# Patient Record
Sex: Female | Born: 1980 | Race: White | Hispanic: No | Marital: Single | Smoking: Current every day smoker
Health system: Southern US, Community
[De-identification: ages and names within clinical notes are randomized; demographics above are authoritative.]

## PROBLEM LIST (undated history)

## (undated) DIAGNOSIS — D649 Anemia, unspecified: Secondary | ICD-10-CM

---

## 2007-07-18 ENCOUNTER — Emergency Department: Payer: Self-pay | Admitting: Emergency Medicine

## 2007-07-18 ENCOUNTER — Other Ambulatory Visit: Payer: Self-pay

## 2008-02-19 ENCOUNTER — Emergency Department: Payer: Self-pay | Admitting: Emergency Medicine

## 2008-03-14 ENCOUNTER — Emergency Department: Payer: Self-pay | Admitting: Emergency Medicine

## 2008-11-02 ENCOUNTER — Inpatient Hospital Stay: Payer: Self-pay | Admitting: Internal Medicine

## 2009-10-27 ENCOUNTER — Emergency Department: Payer: Self-pay | Admitting: Emergency Medicine

## 2011-10-29 ENCOUNTER — Emergency Department: Payer: Self-pay | Admitting: *Deleted

## 2013-02-17 ENCOUNTER — Emergency Department: Payer: Self-pay

## 2013-10-03 ENCOUNTER — Inpatient Hospital Stay: Payer: Self-pay | Admitting: Obstetrics & Gynecology

## 2013-10-03 LAB — COMPREHENSIVE METABOLIC PANEL
Albumin: 3.6 g/dL (ref 3.4–5.0)
BUN: 9 mg/dL (ref 7–18)
Calcium, Total: 9.1 mg/dL (ref 8.5–10.1)
Chloride: 102 mmol/L (ref 98–107)
Creatinine: 0.89 mg/dL (ref 0.60–1.30)
EGFR (African American): >60
Glucose: 169 mg/dL — ABNORMAL HIGH (ref 65–99)
Osmolality: 269 (ref 275–301)
SGOT(AST): 23 U/L (ref 15–37)
Sodium: 133 mmol/L — ABNORMAL LOW (ref 136–145)
Total Protein: 7.8 g/dL (ref 6.4–8.2)

## 2013-10-03 LAB — URINALYSIS, COMPLETE
Bacteria: NONE SEEN
Bilirubin,UR: NEGATIVE
Nitrite: NEGATIVE
Protein: 30
Specific Gravity: 1.021 (ref 1.003–1.030)
WBC UR: 108 /HPF (ref 0–5)

## 2013-10-03 LAB — LIPASE, BLOOD: Lipase: 64 U/L — ABNORMAL LOW (ref 73–393)

## 2013-10-03 LAB — CBC
HGB: 11.1 g/dL — ABNORMAL LOW (ref 12.0–16.0)
MCV: 84 fL (ref 80–100)
RBC: 3.87 10*6/uL (ref 3.80–5.20)
RDW: 14.8 % — ABNORMAL HIGH (ref 11.5–14.5)

## 2013-10-03 LAB — PREGNANCY, URINE: Pregnancy Test, Urine: NEGATIVE m[IU]/mL

## 2013-10-04 LAB — WBC: WBC: 16.2 10*3/uL — ABNORMAL HIGH (ref 3.6–11.0)

## 2015-07-11 ENCOUNTER — Emergency Department: Payer: Self-pay

## 2015-07-11 ENCOUNTER — Encounter: Payer: Self-pay | Admitting: Emergency Medicine

## 2015-07-11 ENCOUNTER — Emergency Department
Admission: EM | Admit: 2015-07-11 | Discharge: 2015-07-11 | Disposition: A | Discharge status: Home / Self Care | Payer: Self-pay | Attending: Emergency Medicine | Admitting: Emergency Medicine

## 2015-07-11 DIAGNOSIS — Z880 Allergy status to penicillin: Secondary | ICD-10-CM | POA: Insufficient documentation

## 2015-07-11 DIAGNOSIS — Y9389 Activity, other specified: Secondary | ICD-10-CM | POA: Insufficient documentation

## 2015-07-11 DIAGNOSIS — Y990 Civilian activity done for income or pay: Secondary | ICD-10-CM | POA: Insufficient documentation

## 2015-07-11 DIAGNOSIS — S0083XA Contusion of other part of head, initial encounter: Secondary | ICD-10-CM | POA: Insufficient documentation

## 2015-07-11 DIAGNOSIS — Z720 Tobacco use: Secondary | ICD-10-CM | POA: Insufficient documentation

## 2015-07-11 DIAGNOSIS — S5002XA Contusion of left elbow, initial encounter: Secondary | ICD-10-CM | POA: Insufficient documentation

## 2015-07-11 DIAGNOSIS — W010XXA Fall on same level from slipping, tripping and stumbling without subsequent striking against object, initial encounter: Secondary | ICD-10-CM | POA: Insufficient documentation

## 2015-07-11 DIAGNOSIS — Y9289 Other specified places as the place of occurrence of the external cause: Secondary | ICD-10-CM | POA: Insufficient documentation

## 2015-07-11 HISTORY — DX: Anemia, unspecified: D64.9

## 2015-07-11 MED ORDER — CYCLOBENZAPRINE HCL 10 MG PO TABS
10.0000 mg | ORAL_TABLET | Freq: Three times a day (TID) | ORAL | Status: DC | PRN
Start: 2015-07-11 — End: 2015-11-14

## 2015-07-11 MED ORDER — IBUPROFEN 800 MG PO TABS: 800.0000 mg | ORAL_TABLET | Freq: Three times a day (TID) | ORAL | Status: DC | PRN

## 2015-07-11 MED ORDER — KETOROLAC TROMETHAMINE 60 MG/2ML IM SOLN
60.0000 mg | Freq: Once | INTRAMUSCULAR | Status: AC
  Administered 2015-07-11: 60 mg via INTRAMUSCULAR
  Filled 2015-07-11: qty 2

## 2015-07-11 NOTE — ED Notes (Signed)
Pt lost her footing, fell at work, now c/o left sided face pain, bruising noted, knot to left temple, bruise to left elbow and swelling noted, large bruise on inside of left arm, pt able to move her left arm. Workers comp case.

## 2015-07-11 NOTE — ED Provider Notes (Signed)
[facility] Emergency Department Provider Note  ____________________________________________  Time seen: Approximately 7:28 PM  I have reviewed the triage vital signs and the nursing notes.   HISTORY  Chief Complaint Fall    HPI Jillian Gray is a 34 y.o. female who presents for evaluation of multiple contusions and bruising. Patient states that she lost her footing and slipped at work complains of pain to the left side of her face and forehead with a knot to her left temple. Bruising to her left elbow increased pain and swelling, and a large bruise noted on the inside of her left arm. Patient denies any loss of consciousness nausea or vomiting at this time. Because complain of all these areas of bruising is her left elbow.   Past Medical History  Diagnosis Date  . Anemia     There are no active problems to display for this patient.   Past Surgical History  Procedure Laterality Date  . Cesarean section      Current Outpatient Rx  Name  Route  Sig  Dispense  Refill  . cyclobenzaprine (FLEXERIL) 10 MG tablet   Oral   Take 1 tablet (10 mg total) by mouth every 8 (eight) hours as needed for muscle spasms.   30 tablet   1   . ibuprofen (ADVIL,MOTRIN) 800 MG tablet   Oral   Take 1 tablet (800 mg total) by mouth every 8 (eight) hours as needed.   30 tablet   0     Allergies Penicillins  No family history on file.  Social History History  Substance Use Topics  . Smoking status: Current Every Day Smoker    Types: Cigarettes  . Smokeless tobacco: Not on file  . Alcohol Use: No    Review of Systems Constitutional: No fever/chills Eyes: No visual changes. ENT: No sore throat. Cardiovascular: Denies chest pain. Respiratory: Denies shortness of breath. Gastrointestinal: No abdominal pain.  No nausea, no vomiting.  No diarrhea.  No constipation. Genitourinary: Negative for dysuria. Musculoskeletal: Positive for left elbow pain with  limited range of motion with extension and adduction. Positive edema noted around left elbow bruising noted to interior left upper arm. Skin: Negative for rash. Neurological: Positive for headache negative for focal weakness or numbness.  10-point ROS otherwise negative.  ____________________________________________   PHYSICAL EXAM:  VITAL SIGNS: ED Triage Vitals  Enc Vitals Group     BP 07/11/15 1902 124/85 mmHg     Pulse Rate 07/11/15 1902 90     Resp 07/11/15 1902 18     Temp 07/11/15 1902 98.8 F (37.1 C)     Temp Source 07/11/15 1902 Oral     SpO2 07/11/15 1902 99 %     Weight 07/11/15 1902 195 lb (88.451 kg)     Height 07/11/15 1902  (1.702 m)     Head Cir --      Peak Flow --      Pain Score 07/11/15 1903 4     Pain Loc --      Pain Edu? --      Excl. in GC? --     Constitutional: Alert and oriented. Well appearing and in no acute distress. Eyes: Conjunctivae are normal. PERRL. EOMI. Head: Atraumatic. Nose: No congestion/rhinnorhea. Mouth/Throat: Mucous membranes are moist.  Oropharynx non-erythematous. Multiple dental caries and missing teeth obvious. Neck: No stridor.  No cervical spine tenderness. Cardiovascular: Normal rate, regular rhythm. Grossly normal heart sounds.  Good peripheral circulation. Respiratory:  Normal respiratory effort.  No retractions. Lungs CTAB. Gastrointestinal: Soft and nontender. No distention. No abdominal bruits. No CVA tenderness. Musculoskeletal: No lower extremity tenderness nor edema.  No joint effusions. Neurologic:  Normal speech and language. No gross focal neurologic deficits are appreciated. No gait instability. Skin: Bruising noted to the left temple. Bruising noted to the left elbow and in her left humerus Psychiatric: Mood and affect are normal. Speech and behavior are normal.  ____________________________________________   LABS (all labs ordered are listed, but only abnormal results are displayed)  Labs Reviewed  - No data to display ____________________________________________    RADIOLOGY  Facial CT and left elbow x-rays negative interpreted by radiologist and reviewed by myself. ____________________________________________   PROCEDURES  Procedure(s) performed: None  Critical Care performed: No  ____________________________________________   INITIAL IMPRESSION / ASSESSMENT AND PLAN / ED COURSE  Pertinent labs & imaging results that were available during my care of the patient were reviewed by me and considered in my medical decision making (see chart for details).  Status post fall with facial contusion and elbow contusion arm contusion. Patient given Rx for Flexeril and Motrin. Work note given for today. Toradol 60 mg IM given while waiting in the ER. Patient voices no other emergency medical complaints at this time will return to the ER with any worsening symptomology. ____________________________________________   FINAL CLINICAL IMPRESSION(S) / ED DIAGNOSES  Final diagnoses:  Fall due to wet surface, initial encounter  Contusion of face, initial encounter  Elbow contusion, left, initial encounter      Evangeline Dakin, PA-C 07/11/15 2037  Sharman Cheek, MD 07/11/15 2211

## 2015-07-11 NOTE — ED Notes (Signed)
Spoke to pt about workers comp [employer]. Pt understood and has REFUSED further workers comp process. Will make MD aware.

## 2015-07-11 NOTE — Discharge Instructions (Signed)
Contusion A contusion is a deep bruise. Contusions are the result of an injury that caused bleeding under the skin. The contusion may turn blue, purple, or yellow. Minor injuries will give you a painless contusion, but more severe contusions may stay painful and swollen for a few weeks.  CAUSES  A contusion is usually caused by a blow, trauma, or direct force to an area of the body. SYMPTOMS   Swelling and redness of the injured area.  Bruising of the injured area.  Tenderness and soreness of the injured area.  Pain. DIAGNOSIS  The diagnosis can be made by taking a history and physical exam. An X-ray, CT scan, or MRI may be needed to determine if there were any associated injuries, such as fractures. TREATMENT  Specific treatment will depend on what area of the body was injured. In general, the best treatment for a contusion is resting, icing, elevating, and applying cold compresses to the injured area. Over-the-counter medicines may also be recommended for pain control. Ask your caregiver what the best treatment is for your contusion. HOME CARE INSTRUCTIONS   Put ice on the injured area.  Put ice in a plastic bag.  Place a towel between your skin and the bag.  Leave the ice on for 15-20 minutes, 3-4 times a day, or as directed by your health care provider.  Only take over-the-counter or prescription medicines for pain, discomfort, or fever as directed by your caregiver. Your caregiver may recommend avoiding anti-inflammatory medicines (aspirin, ibuprofen, and naproxen) for 48 hours because these medicines may increase bruising.  Rest the injured area.  If possible, elevate the injured area to reduce swelling. SEEK IMMEDIATE MEDICAL CARE IF:   You have increased bruising or swelling.  You have pain that is getting worse.  Your swelling or pain is not relieved with medicines. MAKE SURE YOU:   Understand these instructions.  Will watch your condition.  Will get help right  away if you are not doing well or get worse. Document Released: 09/11/2005 Document Revised: 12/07/2013 Document Reviewed: 10/07/2011 [facility] Patient Information 2015 [place], [place]. This information is not intended to replace advice given to you by your health care provider. Make sure you discuss any questions you have with your health care provider.  Elbow Contusion An elbow contusion is a deep bruise of the elbow. Contusions are the result of an injury that caused bleeding under the skin. The contusion may turn blue, purple, or yellow. Minor injuries will give you a painless contusion, but more severe contusions may stay painful and swollen for a few weeks.  CAUSES  An elbow contusion comes from a direct force to that area, such as falling on the elbow. SYMPTOMS   Swelling and redness of the elbow.  Bruising of the elbow area.  Tenderness or soreness of the elbow. DIAGNOSIS  You will have a physical exam and will be asked about your history. You may need an X-ray of your elbow to look for a broken bone (fracture).  TREATMENT  A sling or splint may be needed to support your injury. Resting, elevating, and applying cold compresses to the elbow area are often the best treatments for an elbow contusion. Over-the-counter medicines may also be recommended for pain control. HOME CARE INSTRUCTIONS   Put ice on the injured area.  Put ice in a plastic bag.  Place a towel between your skin and the bag.  Leave the ice on for 15-20 minutes, 03-04 times a day.  Only take  over-the-counter or prescription medicines for pain, discomfort, or fever as directed by your caregiver.  Rest your injured elbow until the pain and swelling are better.  Elevate your elbow to reduce swelling.  Apply a compression wrap as directed by your caregiver. This can help reduce swelling and motion. You may remove the wrap for sleeping, showers, and baths. If your fingers become numb, cold, or blue, take the wrap  off and reapply it more loosely.  Use your elbow only as directed by your caregiver. You may be asked to do range of motion exercises. Do them as directed.  See your caregiver as directed. It is very important to keep all follow-up appointments in order to avoid any long-term problems with your elbow, including chronic pain or inability to move your elbow normally. SEEK IMMEDIATE MEDICAL CARE IF:   You have increased redness, swelling, or pain in your elbow.  Your swelling or pain is not relieved with medicines.  You have swelling of the hand and fingers.  You are unable to move your fingers or wrist.  You begin to lose feeling in your hand or fingers.  Your fingers or hand become cold or blue. MAKE SURE YOU:   Understand these instructions.  Will watch your condition.  Will get help right away if you are not doing well or get worse. Document Released: 11/10/2006 Document Revised: 02/24/2012 Document Reviewed: 10/18/2011 [facility] Patient Information 2015 [place], [place]. This information is not intended to replace advice given to you by your health care provider. Make sure you discuss any questions you have with your health care provider.

## 2015-11-14 ENCOUNTER — Encounter: Payer: Self-pay | Admitting: Emergency Medicine

## 2015-11-14 ENCOUNTER — Emergency Department
Admission: EM | Admit: 2015-11-14 | Discharge: 2015-11-14 | Disposition: A | Discharge status: Home / Self Care | Payer: Self-pay | Attending: Emergency Medicine | Admitting: Emergency Medicine

## 2015-11-14 ENCOUNTER — Emergency Department: Payer: Self-pay

## 2015-11-14 DIAGNOSIS — Z880 Allergy status to penicillin: Secondary | ICD-10-CM | POA: Insufficient documentation

## 2015-11-14 DIAGNOSIS — F17210 Nicotine dependence, cigarettes, uncomplicated: Secondary | ICD-10-CM | POA: Insufficient documentation

## 2015-11-14 DIAGNOSIS — M51369 Other intervertebral disc degeneration, lumbar region without mention of lumbar back pain or lower extremity pain: Secondary | ICD-10-CM

## 2015-11-14 DIAGNOSIS — M5136 Other intervertebral disc degeneration, lumbar region: Secondary | ICD-10-CM | POA: Insufficient documentation

## 2015-11-14 MED ORDER — MELOXICAM 15 MG PO TABS: 15.0000 mg | ORAL_TABLET | Freq: Every day | ORAL | Status: DC

## 2015-11-14 MED ORDER — HYDROCODONE-ACETAMINOPHEN 5-325 MG PO TABS: 1.0000 | ORAL_TABLET | ORAL | Status: DC | PRN

## 2015-11-14 MED ORDER — BACLOFEN 10 MG PO TABS: 10.0000 mg | ORAL_TABLET | Freq: Three times a day (TID) | ORAL | Status: DC

## 2015-11-14 MED ORDER — KETOROLAC TROMETHAMINE 60 MG/2ML IM SOLN
60.0000 mg | Freq: Once | INTRAMUSCULAR | Status: AC
  Administered 2015-11-14: 60 mg via INTRAMUSCULAR
  Filled 2015-11-14: qty 2

## 2015-11-14 MED ORDER — DIAZEPAM 5 MG/ML IJ SOLN
5.0000 mg | Freq: Once | INTRAMUSCULAR | Status: AC
  Administered 2015-11-14: 5 mg via INTRAMUSCULAR
  Filled 2015-11-14: qty 2

## 2015-11-14 NOTE — ED Provider Notes (Signed)
[facility] Emergency Department Provider Note  ____________________________________________  Time seen: Approximately 11:41 AM  I have reviewed the triage vital signs and the nursing notes.   HISTORY  Chief Complain Back Pain    HPI Jillian Gray is a 34 y.o. female who presents emergency room via her boss with complaints of low back pain that started approximately 3 days ago. Patient denies any trauma. States that the pain is progressively got worse over the last several days where now she is unable to stand up straight without the pain. Denies any urinary symptoms at this time.   Past Medical History  Diagnosis Date  . Anemia     There are no active problems to display for this patient.   Past Surgical History  Procedure Laterality Date  . Cesarean section      Current Outpatient Rx  Name  Route  Sig  Dispense  Refill  . baclofen (LIORESAL) 10 MG tablet   Oral   Take 1 tablet (10 mg total) by mouth 3 (three) times daily.   30 tablet   0   . HYDROcodone-acetaminophen (NORCO) 5-325 MG tablet   Oral   Take 1-2 tablets by mouth every 4 (four) hours as needed for moderate pain.   15 tablet   0   . meloxicam (MOBIC) 15 MG tablet   Oral   Take 1 tablet (15 mg total) by mouth daily.   30 tablet   0     Allergies Penicillins  No family history on file.  Social History Social History  Substance Use Topics  . Smoking status: Current Every Day Smoker -- 0.50 packs/day    Types: Cigarettes  . Smokeless tobacco: None  . Alcohol Use: No    Review of Systems Constitutional: No fever/chills Eyes: No visual changes. ENT: No sore throat. Cardiovascular: Denies chest pain. Respiratory: Denies shortness of breath. Gastrointestinal: No abdominal pain.  No nausea, no vomiting.  No diarrhea.  No constipation. Genitourinary: Negative for dysuria. Musculoskeletal: Positive for low back pain and left paraspinal region. Skin: Negative for  rash. Neurological: Negative for headaches, focal weakness or numbness.  10-point ROS otherwise negative.  ____________________________________________   PHYSICAL EXAM:  VITAL SIGNS: ED Triage Vitals  Enc Vitals Group     BP 11/14/15 1130 144/67 mmHg     Pulse Rate 11/14/15 1130 90     Resp --      Temp 11/14/15 1130 97.9 F (36.6 C)     Temp Source 11/14/15 1130 Oral     SpO2 11/14/15 1130 98 %     Weight 11/14/15 1130 189 lb (85.73 kg)     Height 11/14/15 1130  (1.702 m)     Head Cir --      Peak Flow --      Pain Score 11/14/15 1131 10     Pain Loc --      Pain Edu? --      Excl. in GC? --     Constitutional: Alert and oriented. Well appearing and in no acute distress. Neck: No stridor.   Cardiovascular: Normal rate, regular rhythm. Grossly normal heart sounds.  Good peripheral circulation. Respiratory: Normal respiratory effort.  No retractions. Lungs CTAB. Gastrointestinal: Soft and nontender. No distention. No abdominal bruits. No CVA tenderness. Musculoskeletal: No lower extremity tenderness nor edema.  No joint effusions. Positive point tenderness to the left paraspinal area with straight leg raise positive bilaterally left worse than right at approximately 30-40.  Neurologic:  Normal speech and language. No gross focal neurologic deficits are appreciated. No gait instability. Ambulates slowly hunched over. Skin:  Skin is warm, dry and intact. No rash noted. Psychiatric: Mood and affect are normal. Speech and behavior are normal.  ____________________________________________   LABS (all labs ordered are listed, but only abnormal results are displayed)  Labs Reviewed - No data to display ____________________________________________   RADIOLOGY  IMPRESSION: 1. No acute or subacute osseous abnormality. 2. Moderate degenerative disc disease at L4-5 and mild degenerative disc disease at L3-4 L5-S1. 3. Degenerative changes involving the sacroiliac joints,  right greater than left.    PROCEDURES  Procedure(s) performed: None  Critical Care performed: No  ____________________________________________   INITIAL IMPRESSION / ASSESSMENT AND PLAN / ED COURSE  Pertinent labs & imaging results that were available during my care of the patient were reviewed by me and considered in my medical decision making (see chart for details).  Moderate degenerative disc disease. Acute exacerbation today Rx given for meloxicam 15 mg daily and baclofen 10 mg 3 times a day as needed. Patient to follow up with PCP or return to the ER with any worsening symptomology. Patient was given an injection of Toradol 60 mg I am and Valium 5 mg IM while in the ED with some relief. No other emergency medical complaints at this time ____________________________________________   FINAL CLINICAL IMPRESSION(S) / ED DIAGNOSES  Final diagnoses:  Degenerative disc disease, lumbar      Evangeline Dakinharles M Carolee Channell, PA-C 11/14/15 1333  Sharman CheekPhillip Stafford, MD 11/14/15 1601

## 2015-11-14 NOTE — ED Notes (Signed)
Pt reports L lower back pain and swelling that started 3 days ago with no apparent cause. Pt unable to stand up straight due to pain and swelling.

## 2015-11-14 NOTE — ED Notes (Signed)
C/o left low back pain

## 2015-11-14 NOTE — Discharge Instructions (Signed)
Degenerative Disk Disease  Degenerative disk disease is a condition caused by the changes that occur in spinal disks as you grow older. Spinal disks are soft and compressible disks located between the bones of your spine (vertebrae). These disks act like shock absorbers. Degenerative disk disease can affect the whole spine. However, the neck and lower back are most commonly affected. Many changes can occur in the spinal disks with aging, such as:  · The spinal disks may dry and shrink.  · Small tears may occur in the tough, outer covering of the disk (annulus).  · The disk space may become smaller due to loss of water.  · Abnormal growths in the bone (spurs) may occur. This can put pressure on the nerve roots exiting the spinal canal, causing pain.  · The spinal canal may become narrowed.  RISK FACTORS   · Being overweight.  · Having a family history of degenerative disk disease.  · Smoking.  · There is increased risk if you are doing heavy lifting or have a sudden injury.  SIGNS AND SYMPTOMS   Symptoms vary from person to person and may include:  · Pain that varies in intensity. Some people have no pain, while others have severe pain. The location of the pain depends on the part of your backbone that is affected.  ¨ You will have neck or arm pain if a disk in the neck area is affected.  ¨ You will have pain in your back, buttocks, or legs if a disk in the lower back is affected.  · Pain that becomes worse while bending, reaching up, or with twisting movements.  · Pain that may start gradually and then get worse as time passes. It may also start after a major or minor injury.  · Numbness or tingling in the arms or legs.  DIAGNOSIS   Your health care provider will ask you about your symptoms and about activities or habits that may cause the pain. He or she may also ask about any injuries, diseases, or treatments you have had. Your health care provider will examine you to check for the range of movement that is  possible in the affected area, to check for strength in your extremities, and to check for sensation in the areas of the arms and legs supplied by different nerve roots. You may also have:   · An X-ray of the spine.  · Other imaging tests, such as MRI.  TREATMENT   Your health care provider will advise you on the best plan for treatment. Treatment may include:  · Medicines.  · Rehabilitation exercises.  HOME CARE INSTRUCTIONS   · Follow proper lifting and walking techniques as advised by your health care provider.  · Maintain good posture.  · Exercise regularly as advised by your health care provider.  · Perform relaxation exercises.  · Change your sitting, standing, and sleeping habits as advised by your health care provider.  · Change positions frequently.  · Lose weight or maintain a healthy weight as advised by your health care provider.  · Do not use any tobacco products, including cigarettes, chewing tobacco, or electronic cigarettes. If you need help quitting, ask your health care provider.  · Wear supportive footwear.  · Take medicines only as directed by your health care provider.  SEEK MEDICAL CARE IF:   · Your pain does not go away within 1-4 weeks.  · You have significant appetite or weight loss.  SEEK IMMEDIATE MEDICAL CARE IF:   ·   Your pain is severe.  · You notice weakness in your arms, hands, or legs.  · You begin to lose control of your bladder or bowel movements.  · You have fevers or night sweats.  MAKE SURE YOU:   · Understand these instructions.  · Will watch your condition.  · Will get help right away if you are not doing well or get worse.     This information is not intended to replace advice given to you by your health care provider. Make sure you discuss any questions you have with your health care provider.     Document Released: 09/29/2007 Document Revised: 12/23/2014 Document Reviewed: 04/05/2014  [employer] Interactive Patient Education ©2016 [employer]

## 2015-12-21 ENCOUNTER — Emergency Department
Admission: EM | Admit: 2015-12-21 | Discharge: 2015-12-21 | Disposition: A | Discharge status: Home / Self Care | Payer: Self-pay | Attending: Emergency Medicine | Admitting: Emergency Medicine

## 2015-12-21 ENCOUNTER — Encounter: Payer: Self-pay | Admitting: *Deleted

## 2015-12-21 DIAGNOSIS — Z880 Allergy status to penicillin: Secondary | ICD-10-CM | POA: Insufficient documentation

## 2015-12-21 DIAGNOSIS — M5442 Lumbago with sciatica, left side: Secondary | ICD-10-CM | POA: Insufficient documentation

## 2015-12-21 DIAGNOSIS — Z79899 Other long term (current) drug therapy: Secondary | ICD-10-CM | POA: Insufficient documentation

## 2015-12-21 DIAGNOSIS — F17210 Nicotine dependence, cigarettes, uncomplicated: Secondary | ICD-10-CM | POA: Insufficient documentation

## 2015-12-21 MED ORDER — MELOXICAM 15 MG PO TABS: 15.0000 mg | ORAL_TABLET | Freq: Every day | ORAL | Status: DC

## 2015-12-21 MED ORDER — DIAZEPAM 5 MG PO TABS
5.0000 mg | ORAL_TABLET | Freq: Once | ORAL | Status: AC
  Administered 2015-12-21: 5 mg via ORAL
  Filled 2015-12-21: qty 1

## 2015-12-21 MED ORDER — KETOROLAC TROMETHAMINE 60 MG/2ML IM SOLN
60.0000 mg | Freq: Once | INTRAMUSCULAR | Status: AC
  Administered 2015-12-21: 60 mg via INTRAMUSCULAR
  Filled 2015-12-21: qty 2

## 2015-12-21 MED ORDER — METHOCARBAMOL 500 MG PO TABS
500.0000 mg | ORAL_TABLET | Freq: Four times a day (QID) | ORAL | Status: DC
Start: 2015-12-21 — End: 2018-09-11

## 2015-12-21 NOTE — ED Provider Notes (Signed)
[facility] Emergency Department Provider Note  ____________________________________________  Time seen: Approximately 10:03 AM  I have reviewed the triage vital signs and the nursing notes.   HISTORY  Chief Complaint Back Pain   HPI Jillian Gray is a 35 y.o. female is here with complaint of low back pain. Patient states she bent over to tie her shoes this morning had sudden onset of increased pain. Patient was seen in November for back pain and was told to follow-up however she has not done this. At that time she did have her back x-rayed which showed degenerative changes. Patient states that mobic Was helping her back pain and she still has several of these at home. Patient has a history of back pain for 2 years. She was brought in to the emergency room by her boss because this happened at work but is not workman's comp. She denies any urinary symptoms or history of kidney stones. Pain is sharp and worse with movement. Patient states pain is radiating into her left leg. Currently she rates her pain 10 over 10.   Past Medical History  Diagnosis Date  . Anemia     There are no active problems to display for this patient.   Past Surgical History  Procedure Laterality Date  . Cesarean section      Current Outpatient Rx  Name  Route  Sig  Dispense  Refill  . baclofen (LIORESAL) 10 MG tablet   Oral   Take 1 tablet (10 mg total) by mouth 3 (three) times daily.   30 tablet   0   . HYDROcodone-acetaminophen (NORCO) 5-325 MG tablet   Oral   Take 1-2 tablets by mouth every 4 (four) hours as needed for moderate pain.   15 tablet   0   . meloxicam (MOBIC) 15 MG tablet   Oral   Take 1 tablet (15 mg total) by mouth daily.   30 tablet   2   . methocarbamol (ROBAXIN) 500 MG tablet   Oral   Take 1 tablet (500 mg total) by mouth 4 (four) times daily.   12 tablet   0     Allergies Penicillins  No family history on file.  Social History Social  History  Substance Use Topics  . Smoking status: Current Every Day Smoker -- 0.50 packs/day    Types: Cigarettes  . Smokeless tobacco: None  . Alcohol Use: No    Review of Systems Constitutional: No fever/chills Cardiovascular: Denies chest pain. Respiratory: Denies shortness of breath. Gastrointestinal: No abdominal pain.  No nausea, no vomiting.   Genitourinary: Negative for dysuria. Musculoskeletal: Positive for back pain. Positive left leg pain. Skin: Negative for rash. Neurological: Negative for headaches, focal weakness or numbness.  10-point ROS otherwise negative.  ____________________________________________   PHYSICAL EXAM:  VITAL SIGNS: ED Triage Vitals  Enc Vitals Group     BP 12/21/15 0933 129/88 mmHg     Pulse Rate 12/21/15 0933 73     Resp 12/21/15 0933 18     Temp 12/21/15 0933 98.6 F (37 C)     Temp Source 12/21/15 0933 Oral     SpO2 12/21/15 0933 100 %     Weight 12/21/15 0933 185 lb (83.915 kg)     Height 12/21/15 0933 5\' 7"  (1.702 m)     Head Cir --      Peak Flow --      Pain Score 12/21/15 0934 10     Pain Loc --  Pain Edu? --      Excl. in GC? --     Constitutional: Alert and oriented. Well appearing and in no acute distress. Eyes: Conjunctivae are normal. PERRL. EOMI. Head: Atraumatic. Nose: No congestion/rhinnorhea. Neck: No stridor.   Cardiovascular: Normal rate, regular rhythm. Grossly normal heart sounds.  Good peripheral circulation. Respiratory: Normal respiratory effort.  No retractions. Lungs CTAB. Gastrointestinal: Soft and nontender. No distention.  Musculoskeletal: Examination of back there is no gross deformity. There is moderate tenderness on palpation of the lumbar spine and paravertebral muscles especially on the left. There is minimal active muscle spasms with patient sitting on the stretcher. She did have an great deal of difficulty getting from supine to sitting position. Gait was not tested. Right leg raises were  limited on the left secondary to pain and approximately only 20. No pain was noted and straight leg raise was approximate 70 on the right. Neurologic:  Normal speech and language. No gross focal neurologic deficits are appreciated. Places were 2+ bilaterally. Skin:  Skin is warm, dry and intact. No rash noted. Psychiatric: Mood and affect are normal. Speech and behavior are normal.  ____________________________________________   LABS (all labs ordered are listed, but only abnormal results are displayed)  Labs Reviewed - No data to display  PROCEDURES  Procedure(s) performed: None  Critical Care performed: No  ____________________________________________   INITIAL IMPRESSION / ASSESSMENT AND PLAN / ED COURSE  Pertinent labs & imaging results that were available during my care of the patient were reviewed by me and considered in my medical decision making (see chart for details).  Patient was given clinics that accept patients with a flea on sliding scale for her income. She is also encouraged follow-up with orthopedist and today's orthopedist was listed on her papers. Patient is continue taking Motrin at 15 mg daily and also given a prescription for Robaxin 500 mg one every ID as a for muscle spasms. Patient is encouraged to use warm compresses to her back for comfort. ____________________________________________   FINAL CLINICAL IMPRESSION(S) / ED DIAGNOSES  Final diagnoses:  Left-sided low back pain with left-sided sciatica      Tommi Rumps, PA-C 12/21/15 1320  Governor Rooks, MD 12/21/15 1425

## 2015-12-21 NOTE — Discharge Instructions (Signed)
Chronic Back Pain  When back pain lasts longer than 3 months, it is called chronic back pain.People with chronic back pain often go through certain periods that are more intense (flare-ups).  CAUSES Chronic back pain can be caused by wear and tear (degeneration) on different structures in your back. These structures include:  The bones of your spine (vertebrae) and the joints surrounding your spinal cord and nerve roots (facets).  The strong, fibrous tissues that connect your vertebrae (ligaments). Degeneration of these structures may result in pressure on your nerves. This can lead to constant pain. HOME CARE INSTRUCTIONS  Avoid bending, heavy lifting, prolonged sitting, and activities which make the problem worse.  Take brief periods of rest throughout the day to reduce your pain. Lying down or standing usually is better than sitting while you are resting.  Take over-the-counter or prescription medicines only as directed by your caregiver. SEEK IMMEDIATE MEDICAL CARE IF:   You have weakness or numbness in one of your legs or feet.  You have trouble controlling your bladder or bowels.  You have nausea, vomiting, abdominal pain, shortness of breath, or fainting.   This information is not intended to replace advice given to you by your health care provider. Make sure you discuss any questions you have with your health care provider.   Document Released: 01/09/2005 Document Revised: 02/24/2012 Document Reviewed: 05/22/2015 Elsevier Interactive Patient Education 2016 [employer]   A list of clinics are on your discharge papers. You'll need to call these clinics to see if they're accepting new patients based on the sliding scale fee. Continue taking motor vehicle once a day. Robaxin 1 tablet 4 times a day for muscle spasms. Ice or heat to your  back as needed for comfort.

## 2015-12-21 NOTE — ED Notes (Signed)
States she bent down to put on her shoes and felt a sharp pain to left side of back  Pain is radiating into left leg unable to bear wt

## 2015-12-21 NOTE — ED Notes (Signed)
Pt has chronic back pain, pt bent over to tie shoes , had sudden onset of increased pain

## 2016-11-23 ENCOUNTER — Emergency Department: Payer: Self-pay

## 2016-11-23 ENCOUNTER — Emergency Department
Admission: EM | Admit: 2016-11-23 | Discharge: 2016-11-23 | Disposition: A | Discharge status: Home / Self Care | Payer: Self-pay | Attending: Emergency Medicine | Admitting: Emergency Medicine

## 2016-11-23 ENCOUNTER — Encounter: Payer: Self-pay | Admitting: Emergency Medicine

## 2016-11-23 DIAGNOSIS — Z79899 Other long term (current) drug therapy: Secondary | ICD-10-CM | POA: Insufficient documentation

## 2016-11-23 DIAGNOSIS — M47817 Spondylosis without myelopathy or radiculopathy, lumbosacral region: Secondary | ICD-10-CM

## 2016-11-23 DIAGNOSIS — F17210 Nicotine dependence, cigarettes, uncomplicated: Secondary | ICD-10-CM | POA: Insufficient documentation

## 2016-11-23 DIAGNOSIS — M1711 Unilateral primary osteoarthritis, right knee: Secondary | ICD-10-CM | POA: Insufficient documentation

## 2016-11-23 DIAGNOSIS — M4307 Spondylolysis, lumbosacral region: Secondary | ICD-10-CM | POA: Insufficient documentation

## 2016-11-23 LAB — POCT PREGNANCY, URINE: Preg Test, Ur: NEGATIVE

## 2016-11-23 MED ORDER — KETOROLAC TROMETHAMINE 60 MG/2ML IM SOLN
60.0000 mg | Freq: Once | INTRAMUSCULAR | Status: AC
  Administered 2016-11-23: 60 mg via INTRAMUSCULAR
  Filled 2016-11-23: qty 2

## 2016-11-23 MED ORDER — HYDROMORPHONE HCL 1 MG/ML IJ SOLN
1.0000 mg | Freq: Once | INTRAMUSCULAR | Status: AC
  Administered 2016-11-23: 1 mg via INTRAMUSCULAR
  Filled 2016-11-23: qty 1

## 2016-11-23 MED ORDER — TRAMADOL HCL 50 MG PO TABS: 50.0000 mg | ORAL_TABLET | Freq: Four times a day (QID) | ORAL | 0 refills | Status: DC | PRN

## 2016-11-23 MED ORDER — MELOXICAM 15 MG PO TABS: 15.0000 mg | ORAL_TABLET | Freq: Every day | ORAL | 0 refills | Status: DC

## 2016-11-23 NOTE — ED Notes (Signed)
Pt crying, states R leg numbness that is making her back hurt. Pt states R knee pain as well. Pt states leg is "always swollen, always stays full of fluid or whatever you call it." States leg is "dead weight." Symptoms x few days, states leg gave out on her and she scrapped knee. Pt alert and oriented.

## 2016-11-23 NOTE — ED Notes (Signed)
Pt returned from xray via stretcher.

## 2016-11-23 NOTE — ED Notes (Signed)
Pt in xray via stretcher

## 2016-11-23 NOTE — ED Triage Notes (Signed)
Right lower leg pain.  Reports long hx of tendon problems.  +pedal pulse

## 2016-11-23 NOTE — ED Notes (Addendum)
Pt ambulatory to restroom with no assist. Pt states she can stand the pain, no more crying.

## 2016-11-23 NOTE — ED Provider Notes (Signed)
[facility] Emergency Department Provider Note   ____________________________________________   None    (approximate)  I have reviewed the triage vital signs and the nursing notes.   HISTORY  Chief Complaint Leg Pain    HPI Jillian Gray is a 35 y.o. female patient complaining of radicular back pain to the right thigh and acute pain to the right knee.Patient states her right leg feels completely numb and that this extremity gives out on her. Patient state a few days ago she fell secondary to the leg being unstable. Patient states she was seen from the pain last year was placed in knee immobilizer was advised to follow orthopedics. Patient state there is a (showing she did not follow-up in the knee has gotten progressively worse. Patient also state her back pain has increased. Patient rates the pain as a 9/10. Patient to her complaint is worse in the last few days. No palliative measures for this complaint. Review of patient's records denies show x-ray of the right knee but do reveal moderate degenerative changes in the lower lumbar spine.  Past Medical History:  Diagnosis Date  . Anemia     There are no active problems to display for this patient.   Past Surgical History:  Procedure Laterality Date  . CESAREAN SECTION      Prior to Admission medications   Medication Sig Start Date End Date Taking? Authorizing Provider  baclofen (LIORESAL) 10 MG tablet Take 1 tablet (10 mg total) by mouth 3 (three) times daily. 11/14/15   Evangeline Dakinharles M Beers, PA-C  HYDROcodone-acetaminophen (NORCO) 5-325 MG tablet Take 1-2 tablets by mouth every 4 (four) hours as needed for moderate pain. 11/14/15   Charmayne Sheerharles M Beers, PA-C  meloxicam (MOBIC) 15 MG tablet Take 1 tablet (15 mg total) by mouth daily. 12/21/15   Tommi Rumpshonda L Summers, PA-C  meloxicam (MOBIC) 15 MG tablet Take 1 tablet (15 mg total) by mouth daily. 11/23/16   Joni Reiningonald K Makesha Belitz, PA-C  methocarbamol (ROBAXIN) 500 MG tablet  Take 1 tablet (500 mg total) by mouth 4 (four) times daily. 12/21/15   Tommi Rumpshonda L Summers, PA-C  traMADol (ULTRAM) 50 MG tablet Take 1 tablet (50 mg total) by mouth every 6 (six) hours as needed. 11/23/16   Joni Reiningonald K Tarita Deshmukh, PA-C    Allergies Penicillins  No family history on file.  Social History Social History  Substance Use Topics  . Smoking status: Current Every Day Smoker    Packs/day: 0.50    Types: Cigarettes  . Smokeless tobacco: Never Used  . Alcohol use No    Review of Systems Constitutional: No fever/chills Eyes: No visual changes. ENT: No sore throat. Cardiovascular: Denies chest pain. Respiratory: Denies shortness of breath. Gastrointestinal: No abdominal pain.  No nausea, no vomiting.  No diarrhea.  No constipation. Genitourinary: Negative for dysuria. Musculoskeletal: Positive for back and right knee/leg pain Skin: Negative for rash. Neurological: Negative for headaches, focal weakness or numbness. Allergic/Immunilogical: Penicillin  ____________________________________________   PHYSICAL EXAM:  VITAL SIGNS: ED Triage Vitals  Enc Vitals Group     BP 11/23/16 1226 (!) 168/99     Pulse Rate 11/23/16 1226 99     Resp 11/23/16 1226 18     Temp 11/23/16 1226 98 F (36.7 C)     Temp src --      SpO2 11/23/16 1226 100 %     Weight 11/23/16 1227 186 lb (84.4 kg)     Height 11/23/16 1227 5\' 7"  (  1.702 m)     Head Circumference --      Peak Flow --      Pain Score 11/23/16 1227 9     Pain Loc --      Pain Edu? --      Excl. in GC? --     Constitutional: Alert and oriented. Well appearing and in no acute distress. Eyes: Conjunctivae are normal. PERRL. EOMI. Head: Atraumatic. Nose: No congestion/rhinnorhea. Mouth/Throat: Mucous membranes are moist.  Oropharynx non-erythematous. Neck: No stridor.  No cervical spine tenderness to palpation. Hematological/Lymphatic/Immunilogical: No cervical lymphadenopathy. Cardiovascular: Normal rate, regular rhythm. Grossly  normal heart sounds.  Good peripheral circulation. Elevated blood pressure Respiratory: Normal respiratory effort.  No retractions. Lungs CTAB. Gastrointestinal: Soft and nontender. No distention. No abdominal bruits. No CVA tenderness. Musculoskeletal: No obvious spinal deformity. Patient has moderate guarding palpation L4-S1. Examination of the right lower extremity shows no obvious deformity edema or ecchymosis. There is an abrasion to the inferior patella consistent with patient history of a recent fall. Moderate crepitus palpation of the anterior patella. No laxity with stress testing. Patient had negative straight leg test. Neurologic:  Normal speech and language. No gross focal neurologic deficits are appreciated. No gait instability. Skin:  Skin is warm, dry and intact. No rash noted. Psychiatric: Mood and affect are normal. Speech and behavior are normal.  ____________________________________________   LABS (all labs ordered are listed, but only abnormal results are displayed)  Labs Reviewed  POC URINE PREG, ED  POCT PREGNANCY, URINE   ____________________________________________  EKG   ____________________________________________  RADIOLOGY X-rays of the lumbar spine and right knee show degenerative changes.  ____________________________________________   PROCEDURES  Procedure(s) performed: None  Procedures  Critical Care performed: No  ____________________________________________   INITIAL IMPRESSION / ASSESSMENT AND PLAN / ED COURSE  Pertinent labs & imaging results that were available during my care of the patient were reviewed by me and considered in my medical decision making (see chart for details).  Low back and right knee pain secondary to DJD. Patient given discharge care instructions. Patient given prescription for meloxicam and and tramadol. Patient [facility] reinstated.  Clinical Course       ____________________________________________   FINAL CLINICAL IMPRESSION(S) / ED DIAGNOSES  Final diagnoses:  DJD (degenerative joint disease), lumbosacral  Primary osteoarthritis of right knee      NEW MEDICATIONS STARTED DURING THIS VISIT:  New Prescriptions   MELOXICAM (MOBIC) 15 MG TABLET    Take 1 tablet (15 mg total) by mouth daily.   TRAMADOL (ULTRAM) 50 MG TABLET    Take 1 tablet (50 mg total) by mouth every 6 (six) hours as needed.     Note:  This document was prepared using Dragon voice recognition software and may include unintentional dictation errors.    Joni Reiningonald K Chaniah Cisse, PA-C 11/23/16 1537    Sharyn CreamerMark Quale, MD 11/23/16 [phone]

## 2017-09-07 ENCOUNTER — Emergency Department: Payer: Self-pay

## 2017-09-07 ENCOUNTER — Emergency Department
Admission: EM | Admit: 2017-09-07 | Discharge: 2017-09-07 | Disposition: A | Discharge status: Home / Self Care | Payer: Self-pay | Attending: Emergency Medicine | Admitting: Emergency Medicine

## 2017-09-07 DIAGNOSIS — B070 Plantar wart: Secondary | ICD-10-CM | POA: Insufficient documentation

## 2017-09-07 DIAGNOSIS — F17210 Nicotine dependence, cigarettes, uncomplicated: Secondary | ICD-10-CM | POA: Insufficient documentation

## 2017-09-07 DIAGNOSIS — Z79899 Other long term (current) drug therapy: Secondary | ICD-10-CM | POA: Insufficient documentation

## 2017-09-07 MED ORDER — SALICYLIC ACID 17 % EX GEL: Freq: Every day | CUTANEOUS | 0 refills | Status: DC

## 2017-09-07 NOTE — ED Provider Notes (Signed)
[facility] Emergency Department Provider Note  ____________________________________________  Time seen: Approximately 10:12 PM  I have reviewed the triage vital signs and the nursing notes.   HISTORY  Chief Complaint Foot Pain    HPI Jillian Gray is a 36 y.o. female presents the emergency department with a painful region of callus along the plantar aspect of her right foot. Patient denies any compromise or surrounding cellulitis. She denies weakness or changes in sensation. Patient states that her 6 out of 10 right foot pain is worse with ambulation and improves with rest. Patient is requesting a work note. She has been afebrile.   Past Medical History:  Diagnosis Date  . Anemia     There are no active problems to display for this patient.   Past Surgical History:  Procedure Laterality Date  . CESAREAN SECTION      Prior to Admission medications   Medication Sig Start Date End Date Taking? Authorizing Provider  baclofen (LIORESAL) 10 MG tablet Take 1 tablet (10 mg total) by mouth 3 (three) times daily. 11/14/15   Beers, Charmayne Sheer, PA-C  HYDROcodone-acetaminophen (NORCO) 5-325 MG tablet Take 1-2 tablets by mouth every 4 (four) hours as needed for moderate pain. 11/14/15   Beers, Charmayne Sheer, PA-C  meloxicam (MOBIC) 15 MG tablet Take 1 tablet (15 mg total) by mouth daily. 12/21/15   Tommi Rumps, PA-C  meloxicam (MOBIC) 15 MG tablet Take 1 tablet (15 mg total) by mouth daily. 11/23/16   Joni Reining, PA-C  methocarbamol (ROBAXIN) 500 MG tablet Take 1 tablet (500 mg total) by mouth 4 (four) times daily. 12/21/15   Tommi Rumps, PA-C  salicylic acid 17 % gel Apply topically daily. 09/07/17   Orvil Feil, PA-C  traMADol (ULTRAM) 50 MG tablet Take 1 tablet (50 mg total) by mouth every 6 (six) hours as needed. 11/23/16   Joni Reining, PA-C    Allergies Penicillins  No family history on file.  Social History Social History  Substance Use  Topics  . Smoking status: Current Every Day Smoker    Packs/day: 0.50    Types: Cigarettes  . Smokeless tobacco: Never Used  . Alcohol use No     Review of Systems  Constitutional: No fever/chills Eyes: No visual changes. No discharge ENT: No upper respiratory complaints. Cardiovascular: no chest pain. Respiratory: no cough. No SOB. Gastrointestinal: No abdominal pain.  No nausea, no vomiting.  No diarrhea.  No constipation. Musculoskeletal: Negative for musculoskeletal pain. Skin: Patient has plantar wart Neurological: Negative for headaches, focal weakness or numbness.   ____________________________________________   PHYSICAL EXAM:  VITAL SIGNS: ED Triage Vitals  Enc Vitals Group     BP 09/07/17 2005 120/79     Pulse Rate 09/07/17 2005 84     Resp 09/07/17 2005 18     Temp 09/07/17 2005 98.7 F (37.1 C)     Temp Source 09/07/17 2005 Oral     SpO2 09/07/17 2005 96 %     Weight 09/07/17 2006 170 lb (77.1 kg)     Height 09/07/17 2006  (1.702 m)     Head Circumference --      Peak Flow --      Pain Score 09/07/17 2005 4     Pain Loc --      Pain Edu? --      Excl. in GC? --      Constitutional: Alert and oriented. Well appearing and in  no acute distress. Eyes: Conjunctivae are normal. PERRL. EOMI. Head: Atraumatic. Cardiovascular: Normal rate, regular rhythm. Normal S1 and S2.  Good peripheral circulation. Respiratory: Normal respiratory effort without tachypnea or retractions. Lungs CTAB. Good air entry to the bases with no decreased or absent breath sounds. Musculoskeletal: Full range of motion to all extremities. No gross deformities appreciated. Neurologic:  Normal speech and language. No gross focal neurologic deficits are appreciated.  Skin:  Plantar wart visualized at the metatarsal head of the right foot. Palpable dorsalis pedis pulse, right. Psychiatric: Mood and affect are normal. Speech and behavior are normal. Patient exhibits appropriate insight  and judgement.   ____________________________________________   LABS (all labs ordered are listed, but only abnormal results are displayed)  Labs Reviewed - No data to display ____________________________________________  EKG   ____________________________________________  RADIOLOGY Geraldo Pitter, personally viewed and evaluated these images (plain radiographs) as part of my medical decision making, as well as reviewing the written report by the radiologist.  Dg Foot Complete Right  Result Date: 09/07/2017 CLINICAL DATA:  Chronic swelling right lower leg. Right foot tenderness plantar aspect. EXAM: RIGHT FOOT COMPLETE - 3+ VIEW COMPARISON:  None. FINDINGS: There is no evidence of fracture or dislocation. There is no evidence of arthropathy or other focal bone abnormality. Soft tissues are unremarkable. IMPRESSION: Negative. Electronically Signed   By: Elberta Fortis M.D.   On: 09/07/2017 21:06    ____________________________________________    PROCEDURES  Procedure(s) performed:    Procedures    Medications - No data to display   ____________________________________________   INITIAL IMPRESSION / ASSESSMENT AND PLAN / ED COURSE  Pertinent labs & imaging results that were available during my care of the patient were reviewed by me and considered in my medical decision making (see chart for details).  Review of the [place] CSRS was performed in accordance of the [facility] prior to dispensing any controlled drugs.    Assessment and Plan: Plantar Wart Patient's diagnosis is consistent with plantar wart. Patient will be discharged home with prescriptions for salicylic acid. Patient is to follow up with podiatry as needed or otherwise directed. Patient is given ED precautions to return to the ED for any worsening or new symptoms. All patient questions were answered.      ____________________________________________  FINAL CLINICAL IMPRESSION(S) / ED DIAGNOSES  Final  diagnoses:  Plantar wart      NEW MEDICATIONS STARTED DURING THIS VISIT:  New Prescriptions   SALICYLIC ACID 17 % GEL    Apply topically daily.        This chart was dictated using voice recognition software/Dragon. Despite best efforts to proofread, errors can occur which can change the meaning. Any change was purely unintentional.    Orvil Feil, PA-C 09/07/17 2357    Sharman Cheek, MD 09/11/17 1531

## 2017-09-07 NOTE — Discharge Instructions (Signed)
Adults: Soak wart in warm water for 5 minutes. Dry area thoroughly. Apply to entire wart surface, allow to dry, and then apply a second time. Avoid contact with surrounding skin. Continue therapy once or twice daily. Resolution may be expected after 4 to 6 weeks; some warts may take longer to remove.

## 2017-09-07 NOTE — ED Notes (Signed)
Pt reports that she is having right foot pain - pt reports that it feels like something is sticking her in the bottom of the foot and she "feels like she is walking on a bouncy ball" - pt denies any injury

## 2017-09-07 NOTE — ED Triage Notes (Signed)
Pt reports chronic swelling to the right lower leg, however pt reports new pain to the bottom of her right foot with tenderness to the pad of her foot, pt denies hx of plantars wart that she knows of

## 2018-09-11 ENCOUNTER — Emergency Department
Admission: EM | Admit: 2018-09-11 | Discharge: 2018-09-11 | Disposition: A | Discharge status: Home / Self Care | Payer: Self-pay | Attending: Emergency Medicine | Admitting: Emergency Medicine

## 2018-09-11 ENCOUNTER — Encounter: Payer: Self-pay | Admitting: *Deleted

## 2018-09-11 ENCOUNTER — Other Ambulatory Visit: Payer: Self-pay

## 2018-09-11 DIAGNOSIS — F17210 Nicotine dependence, cigarettes, uncomplicated: Secondary | ICD-10-CM | POA: Insufficient documentation

## 2018-09-11 DIAGNOSIS — Z79899 Other long term (current) drug therapy: Secondary | ICD-10-CM | POA: Insufficient documentation

## 2018-09-11 DIAGNOSIS — M5410 Radiculopathy, site unspecified: Secondary | ICD-10-CM | POA: Insufficient documentation

## 2018-09-11 MED ORDER — ORPHENADRINE CITRATE 30 MG/ML IJ SOLN
60.0000 mg | INTRAMUSCULAR | Status: AC
  Administered 2018-09-11: 60 mg via INTRAMUSCULAR
  Filled 2018-09-11: qty 2

## 2018-09-11 MED ORDER — MELOXICAM 15 MG PO TABS: 15.0000 mg | ORAL_TABLET | Freq: Every day | ORAL | 0 refills | Status: AC

## 2018-09-11 MED ORDER — GABAPENTIN 300 MG PO CAPS: 300.0000 mg | ORAL_CAPSULE | Freq: Three times a day (TID) | ORAL | 0 refills | Status: DC

## 2018-09-11 MED ORDER — GABAPENTIN 600 MG PO TABS
300.0000 mg | ORAL_TABLET | Freq: Once | ORAL | Status: AC
  Administered 2018-09-11: 300 mg via ORAL
  Filled 2018-09-11: qty 1

## 2018-09-11 MED ORDER — KETOROLAC TROMETHAMINE 30 MG/ML IJ SOLN
30.0000 mg | Freq: Once | INTRAMUSCULAR | Status: AC
  Administered 2018-09-11: 30 mg via INTRAMUSCULAR
  Filled 2018-09-11: qty 1

## 2018-09-11 MED ORDER — CYCLOBENZAPRINE HCL 5 MG PO TABS: 5.0000 mg | ORAL_TABLET | Freq: Three times a day (TID) | ORAL | 0 refills | Status: DC | PRN

## 2018-09-11 NOTE — ED Triage Notes (Addendum)
PT to ED reporting pain, burning and pressure in right knee and lower leg. Chronic pain x 3 years with no new injury but the burning sensation is new per pt. Pt reports there is a "lump" above her kneecap that hurts when pressure is applied.

## 2018-09-11 NOTE — ED Provider Notes (Signed)
[facility] Emergency Department Provider Note ____________________________________________  Time seen: 1545  I have reviewed the triage vital signs and the nursing notes.  HISTORY  Chief Complaint  Leg Pain  HPI Jillian Gray is a 37 y.o. female who presents herself to the ED for evaluation of acute on chronic right lower extremity pain.  She gives a history of chronic pain over the last 3 years to the right-sided low back and the right lower extremity.  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  She is been managing her symptoms with over-the-counter Tylenol and Motrin, and has not been evaluated other than an emergency room visit some 2 years prior.  She denies any recent injury, accident, or trauma.  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  She reports pain to the posterior lateral lower leg and knee, that is aggravated by attempts to fully extend the knee and bear weight.  She also notes some intermittent catching and locking to the knee.  She localizes pain to the lateral aspect of the knee at the quadriceps insertion proximally.  She denies any foot drop, or leg weakness.  She describes pain as burning and pressure primarily to the lateral aspect of the knee and calf.  She notes at times, the pain appears to start at her right lateral hip and buttocks.  She is without incontinence or saddle anesthesias.  Past Medical History:  Diagnosis Date  . Anemia     There are no active problems to display for this patient.   Past Surgical History:  Procedure Laterality Date  . CESAREAN SECTION      Prior to Admission medications   Medication Sig Start Date End Date Taking? Authorizing Provider  baclofen (LIORESAL) 10 MG tablet Take 1 tablet (10 mg total) by mouth 3 (three) times  daily. 11/14/15   Beers, Charmayne Sheer, PA-C  cyclobenzaprine (FLEXERIL) 5 MG tablet Take 1 tablet (5 mg total) by mouth 3 (three) times daily as needed for muscle spasms. 09/11/18   Isabellamarie Randa, Charlesetta Ivory, PA-C  gabapentin (NEURONTIN) 300 MG capsule Take 1 capsule (300 mg total) by mouth 3 (three) times daily for 15 days. Increase dose slowly to TID as discussed. 09/11/18 09/26/18  Ericia Moxley, Charlesetta Ivory, PA-C  meloxicam (MOBIC) 15 MG tablet Take 1 tablet (15 mg total) by mouth daily. 09/11/18 10/11/18  Stanislaus Kaltenbach, Charlesetta Ivory, PA-C    Allergies Penicillins  History reviewed. No pertinent family history.  Social History Social History   Tobacco Use  . Smoking status: Current Every Day Smoker    Packs/day: 0.50    Types: Cigarettes  . Smokeless tobacco: Never Used  Substance Use Topics  . Alcohol use: No  . Drug use: Not on file    Review of Systems  Constitutional: Negative for fever. Cardiovascular: Negative for chest pain. Respiratory: Negative for shortness of breath. Gastrointestinal: Negative for abdominal pain, vomiting and diarrhea. Genitourinary: Negative for dysuria. Musculoskeletal: Positive for back pain and RLE paresthesias.  Skin: Negative for rash. Neurological: Negative for headaches, focal weakness or numbness. ____________________________________________  PHYSICAL EXAM:  VITAL SIGNS: ED Triage Vitals  Enc Vitals Group     BP 09/11/18 1454 128/79     Pulse Rate 09/11/18 1454 95     Resp 09/11/18 1454 18  Temp 09/11/18 1454 98.5 F (36.9 C)     Temp Source 09/11/18 1454 Oral     SpO2 09/11/18 1454 96 %     Weight 09/11/18 1454 185 lb (83.9 kg)     Height 09/11/18 1454 5\' 7"  (1.702 m)     Head Circumference --      Peak Flow --      Pain Score 09/11/18 1509 5     Pain Loc --      Pain Edu? --      Excl. in GC? --     Constitutional: Alert and oriented. Well appearing and in no distress. Head: Normocephalic and atraumatic. Eyes: Conjunctivae are  normal. Normal extraocular movements Cardiovascular: Normal rate, regular rhythm. Normal distal pulses.  Capillary refill. Respiratory: Normal respiratory effort. No wheezes/rales/rhonchi. Gastrointestinal: Soft and nontender. No distention. Musculoskeletal: Normal spinal alignment without midline tenderness, spasm, deformity, or step-off.  Patient's right knee without obvious deformity, dislocation, or effusion.  Patient localizes tenderness to the posterior lateral aspect of the knee, at the hamstring insertion.  She is able to demonstrate normal knee flexion extension range without crepitus.  No valgus or varus joint stress is elicited.  Negative anterior/posterior drawer.  Patella tracks normally without ballottement or laxity.  No significant calf or Achilles tenderness distally.  Nontender with normal range of motion in all other extremities.  Neurologic: Antalgic gait without ataxia. Normal speech and language. No gross focal neurologic deficits are appreciated. Skin:  Skin is warm, dry and intact. No rash noted. ____________________________________________  PROCEDURES  Procedures Gabapentin 300 mg PO Norflex 60 mg IM Toradol 30 mg IM ____________________________________________  INITIAL IMPRESSION / ASSESSMENT AND PLAN / ED COURSE  Patient with ED evaluation of acute on chronic pain for the last few days.  Patient's exam is overall benign without any acute internal derangement of the knee appreciated.  Symptoms likely represent radicular symptoms from the lumbar spine.  Review of the patient's x-ray reveals some mild to moderate DDD at the last 3 disc levels, from 2017.  Patient will be discharged with prescriptions for gabapentin, Flexeril, and Lexiscan.  She is advised to follow-up with local community clinic for ongoing symptom management.  Return precautions have been reviewed. ____________________________________________  FINAL CLINICAL IMPRESSION(S) / ED DIAGNOSES  Final  diagnoses:  Radicular pain of right lower extremity      Karmen Stabs, Charlesetta Ivory, PA-C 09/11/18 1952    Dionne Bucy, MD 09/11/18 2319

## 2018-09-11 NOTE — ED Notes (Signed)
Non pitting edema right knee

## 2018-09-11 NOTE — Discharge Instructions (Signed)
Your exam is consistent with pain in the leg that is likely from your lumbar spine. Take the prescription medicines as directed. Follow-up with one of the local community clinics for ongoing symptoms.

## 2019-05-04 ENCOUNTER — Other Ambulatory Visit: Payer: Self-pay

## 2019-05-04 ENCOUNTER — Emergency Department
Admission: EM | Admit: 2019-05-04 | Discharge: 2019-05-04 | Disposition: A | Discharge status: Home / Self Care | Payer: Self-pay | Attending: Emergency Medicine | Admitting: Emergency Medicine

## 2019-05-04 DIAGNOSIS — X58XXXA Exposure to other specified factors, initial encounter: Secondary | ICD-10-CM | POA: Insufficient documentation

## 2019-05-04 DIAGNOSIS — S239XXA Sprain of unspecified parts of thorax, initial encounter: Secondary | ICD-10-CM | POA: Insufficient documentation

## 2019-05-04 DIAGNOSIS — F17210 Nicotine dependence, cigarettes, uncomplicated: Secondary | ICD-10-CM | POA: Insufficient documentation

## 2019-05-04 DIAGNOSIS — M62830 Muscle spasm of back: Secondary | ICD-10-CM | POA: Insufficient documentation

## 2019-05-04 DIAGNOSIS — Y939 Activity, unspecified: Secondary | ICD-10-CM | POA: Insufficient documentation

## 2019-05-04 DIAGNOSIS — Y929 Unspecified place or not applicable: Secondary | ICD-10-CM | POA: Insufficient documentation

## 2019-05-04 DIAGNOSIS — Y998 Other external cause status: Secondary | ICD-10-CM | POA: Insufficient documentation

## 2019-05-04 MED ORDER — KETOROLAC TROMETHAMINE 30 MG/ML IJ SOLN
30.0000 mg | Freq: Once | INTRAMUSCULAR | Status: AC
  Administered 2019-05-04: 30 mg via INTRAMUSCULAR
  Filled 2019-05-04: qty 1

## 2019-05-04 MED ORDER — DICLOFENAC SODIUM 50 MG PO TBEC: 50.0000 mg | DELAYED_RELEASE_TABLET | Freq: Two times a day (BID) | ORAL | 0 refills | Status: AC

## 2019-05-04 MED ORDER — CYCLOBENZAPRINE HCL 5 MG PO TABS: 5.0000 mg | ORAL_TABLET | Freq: Three times a day (TID) | ORAL | 0 refills | Status: DC | PRN

## 2019-05-04 NOTE — ED Notes (Signed)
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

## 2019-05-04 NOTE — Discharge Instructions (Addendum)
Your exam is consistent with muscle strain and spasm. Take the prescription meds as directed. Apply ice and/or moist heat to reduce symptoms. Follow-up with a community clinic or return as needed.

## 2019-05-04 NOTE — ED Triage Notes (Signed)
Pt c/o right shoulder pain since Friday. Unknown injury

## 2019-05-04 NOTE — ED Provider Notes (Signed)
[facility] Emergency Department Provider Note ____________________________________________  Time seen: 1138  I have reviewed the triage vital signs and the nursing notes.  HISTORY  Chief Complaint  Shoulder Pain  HPI Jillian Gray is a 38 y.o. female presents herself to the ED for evaluation of what she describes as shoulder pain.  Patient localized tenderness, however to the right scapular border medially.  She denies any preceding injury, accident, or trauma.  She denies any chest pain, shortness of breath, nausea, vomiting, diaphoresis, or syncope.  She describes the pain is worse with palpation, and increases with range of motion of the right upper extremity.  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  She presents now for further evaluation and management.  Past Medical History:  Diagnosis Date  . Anemia     There are no active problems to display for this patient.   Past Surgical History:  Procedure Laterality Date  . CESAREAN SECTION      Prior to Admission medications   Medication Sig Start Date End Date Taking? Authorizing Provider  cyclobenzaprine (FLEXERIL) 5 MG tablet Take 1 tablet (5 mg total) by mouth 3 (three) times daily as needed for muscle spasms. 05/04/19   Miliyah Luper, Charlesetta Ivory, PA-C  diclofenac (VOLTAREN) 50 MG EC tablet Take 1 tablet (50 mg total) by mouth 2 (two) times daily for 30 days. 05/04/19 06/03/19  Akela Pocius, Charlesetta Ivory, PA-C    Allergies Penicillins  No family history on file.  Social History Social History   Tobacco Use  . Smoking status: Current Every Day Smoker    Packs/day: 0.50    Types: Cigarettes  . Smokeless tobacco: Never Used  Substance Use Topics  . Alcohol use: No  . Drug use: Not on file    Review of Systems  Constitutional: Negative for fever. Eyes: Negative for visual changes. ENT: Negative for  sore throat. Cardiovascular: Negative for chest pain. Respiratory: Negative for shortness of breath. Gastrointestinal: Negative for abdominal pain, vomiting and diarrhea. Genitourinary: Negative for dysuria. Musculoskeletal: Positive for back pain. Skin: Negative for rash. Neurological: Negative for headaches, focal weakness or numbness. ____________________________________________  PHYSICAL EXAM:  VITAL SIGNS: ED Triage Vitals  Enc Vitals Group     BP 05/04/19 1050 128/74     Pulse Rate 05/04/19 1050 88     Resp 05/04/19 1050 17     Temp 05/04/19 1050 98.4 F (36.9 C)     Temp Source 05/04/19 1050 Oral     SpO2 05/04/19 1050 97 %     Weight 05/04/19 1045 184 lb (83.5 kg)     Height 05/04/19 1045 5\' 5"  (1.651 m)     Head Circumference --      Peak Flow --      Pain Score 05/04/19 1045 5     Pain Loc --      Pain Edu? --      Excl. in GC? --     Constitutional: Alert and oriented. Well appearing and in no distress. Head: Normocephalic and atraumatic. Eyes: Conjunctivae are normal. Normal extraocular movements Neck: Supple. No thyromegaly. Cardiovascular: Normal rate, regular rhythm. Normal distal pulses. Respiratory: Normal respiratory effort. No wheezes/rales/rhonchi. Musculoskeletal: Normal spinal alignment without midline tenderness, spasm, deformity, or step-off.  Patient tender to palpation to the right medial scapular border.  She is able to demonstrate normal range of the upper extremities  without deficit.  Normal rotator cuff testing bilaterally.  Normal composite fist distally.  Nontender with normal range of motion in all extremities.  Neurologic: Cranial nerves II through XII grossly intact.  Normal UEs DTRs bilaterally.  Normal gait without ataxia. Normal speech and language. No gross focal neurologic deficits are appreciated. Skin:  Skin is warm, dry and intact. No rash noted. Psychiatric: Mood and affect are normal. Patient exhibits appropriate insight and  judgment. ____________________________________________  PROCEDURES  Procedures Toradol 30 mg IM ____________________________________________  INITIAL IMPRESSION / ASSESSMENT AND PLAN / ED COURSE  Jillian Gray was evaluated in Emergency Department on 05/04/2019 for the symptoms described in the history of present illness. She was evaluated in the context of the global COVID-19 pandemic, which necessitated consideration that the patient might be at risk for infection with the SARS-CoV-2 virus that causes COVID-19. Institutional protocols and algorithms that pertain to the evaluation of patients at risk for COVID-19 are in a state of rapid change based on information released by regulatory bodies including the CDC and federal and state organizations. These policies and algorithms were followed during the patient's care in the ED.  DDX: CAP, MSK pain, trauma  Presents herself to the ED for evaluation of midback pain. Her exam is consistent with muscle strain and spasm. She will be discharged with prescriptions for cyclobenzaprine and Diclofenac. A work note is provider for 2 days.  ____________________________________________  FINAL CLINICAL IMPRESSION(S) / ED DIAGNOSES  Final diagnoses:  Thoracic back sprain, initial encounter  Muscle spasm of back      Lissa HoardMenshew, Aragon Scarantino V Bacon, PA-C 05/04/19 1640    Sharman CheekStafford, Phillip, MD 05/05/19 1556

## 2019-09-12 ENCOUNTER — Other Ambulatory Visit: Payer: Self-pay

## 2019-09-12 ENCOUNTER — Emergency Department
Admission: EM | Admit: 2019-09-12 | Discharge: 2019-09-12 | Discharge status: Against Medical Advice | Payer: Self-pay | Attending: Emergency Medicine | Admitting: Emergency Medicine

## 2019-09-12 ENCOUNTER — Emergency Department: Payer: Self-pay

## 2019-09-12 DIAGNOSIS — F17210 Nicotine dependence, cigarettes, uncomplicated: Secondary | ICD-10-CM | POA: Insufficient documentation

## 2019-09-12 DIAGNOSIS — Z5329 Procedure and treatment not carried out because of patient's decision for other reasons: Secondary | ICD-10-CM | POA: Insufficient documentation

## 2019-09-12 DIAGNOSIS — Z79899 Other long term (current) drug therapy: Secondary | ICD-10-CM | POA: Insufficient documentation

## 2019-09-12 DIAGNOSIS — I639 Cerebral infarction, unspecified: Secondary | ICD-10-CM | POA: Insufficient documentation

## 2019-09-12 LAB — DIFFERENTIAL
Abs Immature Granulocytes: 0.02 10*3/uL (ref 0.00–0.07)
Basophils Absolute: 0 10*3/uL (ref 0.0–0.1)
Basophils Relative: 1 %
Eosinophils Absolute: 0.4 10*3/uL (ref 0.0–0.5)
Eosinophils Relative: 6 %
Immature Granulocytes: 0 %
Lymphocytes Relative: 35 %
Lymphs Abs: 2.7 10*3/uL (ref 0.7–4.0)
Monocytes Absolute: 0.4 10*3/uL (ref 0.1–1.0)
Monocytes Relative: 6 %
Neutro Abs: 4 10*3/uL (ref 1.7–7.7)
Neutrophils Relative %: 52 %

## 2019-09-12 LAB — COMPREHENSIVE METABOLIC PANEL
ALT: 13 U/L (ref 0–44)
AST: 21 U/L (ref 15–41)
Albumin: 3.6 g/dL (ref 3.5–5.0)
Alkaline Phosphatase: 49 U/L (ref 38–126)
Anion gap: 8 (ref 5–15)
BUN: 11 mg/dL (ref 6–20)
CO2: 24 mmol/L (ref 22–32)
Calcium: 8.6 mg/dL — ABNORMAL LOW (ref 8.9–10.3)
Chloride: 106 mmol/L (ref 98–111)
Creatinine, Ser: 0.76 mg/dL (ref 0.44–1.00)
GFR calc Af Amer: >60 mL/min (ref >60)
GFR calc non Af Amer: >60 mL/min (ref >60)
Glucose, Bld: 90 mg/dL (ref 70–99)
Potassium: 4 mmol/L (ref 3.5–5.1)
Sodium: 138 mmol/L (ref 135–145)
Total Bilirubin: 0.4 mg/dL (ref 0.3–1.2)
Total Protein: 6.5 g/dL (ref 6.5–8.1)

## 2019-09-12 LAB — CBC
HCT: 39.7 % (ref 36.0–46.0)
Hemoglobin: 13.3 g/dL (ref 12.0–15.0)
MCH: 29.5 pg (ref 26.0–34.0)
MCHC: 33.5 g/dL (ref 30.0–36.0)
MCV: 88 fL (ref 80.0–100.0)
Platelets: 351 10*3/uL (ref 150–400)
RBC: 4.51 MIL/uL (ref 3.87–5.11)
RDW: 13.3 % (ref 11.5–15.5)
WBC: 7.6 10*3/uL (ref 4.0–10.5)
nRBC: 0 % (ref 0.0–0.2)

## 2019-09-12 LAB — GLUCOSE, CAPILLARY: Glucose-Capillary: 87 mg/dL (ref 70–99)

## 2019-09-12 MED ORDER — IOHEXOL 350 MG/ML SOLN: 75.0000 mL | Freq: Once | INTRAVENOUS | Status: DC | PRN

## 2019-09-12 NOTE — ED Notes (Signed)
EDP Isaacs at bedside. Will continue completing asmt/orders once EDP done with pt.

## 2019-09-12 NOTE — ED Notes (Signed)
Pt suddenly frustrated and stating that she wants to leave.

## 2019-09-12 NOTE — Discharge Instructions (Addendum)
START TAKING A BABY ASPIRIN DAILY  RETURN TO THE ER IF YOU DECIDE YOU WANT MEDICAL CARE

## 2019-09-12 NOTE — ED Triage Notes (Signed)
Pt presents via POV c/o left ear pain, "head fullness". Pt reports balance is off. Also reports unable to hold object while at work.

## 2019-09-12 NOTE — ED Notes (Signed)
Teleneuro computer at bedside. Pt up to bedside toilet.

## 2019-09-12 NOTE — ED Notes (Addendum)
Blue/grn/lav tubes sent to lab again. Pt understands urine sample needed and agrees to notify this RN when sample is ready.

## 2019-09-12 NOTE — ED Notes (Signed)
EDP Isaacs talking with pt at bedside. Pt demanding to leave AMA. Pt signed to leave AMA.

## 2019-09-12 NOTE — ED Notes (Signed)
Switched to STAT teleneuro consult instead of "code stroke" as EDP Isaacs currently calling pt VAN negative. Verbal order from [place].

## 2019-09-12 NOTE — ED Provider Notes (Signed)
[facility] Emergency Department Provider Note  ____________________________________________   First MD Initiated Contact with Patient 09/12/19 2050     (approximate)  I have reviewed the triage vital signs and the nursing notes.   HISTORY  Chief Complaint Dizziness    HPI Jillian Gray is a 38 y.o. female with past medical history as below here with transient left-sided weakness and speech difficulty.  The patient states she was at work today.  At around 2:00, she began to develop what she describes as a foggy sensation in her head, along with difficulty comprehending what people are telling her.  She also noticed that she was clumsy and somewhat weak on her left arm and leg.  She states that her symptoms have persisted since onset.  They have not worsened.  No vision changes.  No right-sided symptoms.  No history of similar symptoms in the past.  She does have a mild, generalized headache, but it is not severe.  She has a strong family history of diabetes, heart disease, as well as stroke but no personal history.  She does smoke several packs of cigarettes daily.  She does not see any physicians.  She is not on any medications.  No over-the-counter medication use.        Past Medical History:  Diagnosis Date   Anemia     There are no active problems to display for this patient.   Past Surgical History:  Procedure Laterality Date   CESAREAN SECTION      Prior to Admission medications   Medication Sig Start Date End Date Taking? Authorizing Provider  cyclobenzaprine (FLEXERIL) 5 MG tablet Take 1 tablet (5 mg total) by mouth 3 (three) times daily as needed for muscle spasms. 05/04/19   Menshew, Charlesetta Ivory, PA-C    Allergies Penicillins  History reviewed. No pertinent family history.  Social History Social History   Tobacco Use   Smoking status: Current Every Day Smoker    Packs/day: 0.50    Types: Cigarettes   Smokeless tobacco:  Never Used  Substance Use Topics   Alcohol use: No   Drug use: Not on file    Review of Systems  Review of Systems  Constitutional: Positive for fatigue. Negative for fever.  HENT: Negative for congestion and sore throat.   Eyes: Negative for visual disturbance.  Respiratory: Negative for cough and shortness of breath.   Cardiovascular: Negative for chest pain.  Gastrointestinal: Negative for abdominal pain, diarrhea, nausea and vomiting.  Genitourinary: Negative for flank pain.  Musculoskeletal: Negative for back pain and neck pain.  Skin: Negative for rash and wound.  Neurological: Positive for speech difficulty, weakness and numbness.  All other systems reviewed and are negative.    ____________________________________________  PHYSICAL EXAM:      VITAL SIGNS: ED Triage Vitals  Enc Vitals Group     BP 09/12/19 1800 117/76     Pulse Rate 09/12/19 1800 80     Resp 09/12/19 1800 16     Temp 09/12/19 1800 98.9 F (37.2 C)     Temp Source 09/12/19 1800 Oral     SpO2 09/12/19 1800 97 %     Weight --      Height --      Head Circumference --      Peak Flow --      Pain Score 09/12/19 1803 3     Pain Loc --      Pain Edu? --  Excl. in GC? --      Physical Exam Vitals signs and nursing note reviewed.  Constitutional:      General: She is not in acute distress.    Appearance: She is well-developed.  HENT:     Head: Normocephalic and atraumatic.  Eyes:     Conjunctiva/sclera: Conjunctivae normal.  Neck:     Musculoskeletal: Neck supple.  Cardiovascular:     Rate and Rhythm: Normal rate and regular rhythm.     Heart sounds: Normal heart sounds. No murmur. No friction rub.  Pulmonary:     Effort: Pulmonary effort is normal. No respiratory distress.     Breath sounds: Normal breath sounds. No wheezing or rales.  Abdominal:     General: There is no distension.     Palpations: Abdomen is soft.     Tenderness: There is no abdominal tenderness.  Skin:     General: Skin is warm.     Capillary Refill: Capillary refill takes less than 2 seconds.  Neurological:     Mental Status: She is alert and oriented to person, place, and time.     Motor: No abnormal muscle tone.      Neurological Exam:  Mental Status: Alert and oriented to person, place, and time. Attention and concentration normal. Speech clear. Recent memory is intact. Cranial Nerves: Visual fields grossly intact. EOMI and PERRLA. No nystagmus noted. Facial sensation intact at forehead, maxillary cheek, and chin/mandible bilaterally. No facial asymmetry or weakness. Hearing grossly normal. Uvula is midline, and palate elevates symmetrically. Normal SCM and trapezius strength. Tongue midline without fasciculations. Motor: Muscle strength 5/5 in proximal and distal UE and LE bilaterally but subjectively weak on left with mild difficulty with fine coordination movements. No pronator drift. Muscle tone normal. Reflexes: 2+ and symmetrical in all four extremities.  Sensation: Intact to light touch in upper and lower extremities distally bilaterally, though subjectively diminished LUE>LLE. Gait: Normal without ataxia. Coordination: Normal FTN bilaterally.    ____________________________________________   LABS (all labs ordered are listed, but only abnormal results are displayed)  Labs Reviewed  COMPREHENSIVE METABOLIC PANEL - Abnormal; Notable for the following components:      Result Value   Calcium 8.6 (*)    All other components within normal limits  CBC  DIFFERENTIAL  GLUCOSE, CAPILLARY  POC URINE PREG, ED  POC URINE PREG, ED    ____________________________________________  EKG: Normal sinus rhythm, ventricular rate 74.  PR 164, QRS 84, QTc 410.  No acute ST or T-segment changes.  No significant change from previous. ________________________________________  RADIOLOGY All imaging, including plain films, CT scans, and ultrasounds, independently reviewed by me, and  interpretations confirmed via formal radiology reads.  ED MD interpretation:   CT head: No acute abnormality  Official radiology report(s): Ct Head Wo Contrast  Result Date: 09/12/2019 CLINICAL DATA:  Neuro deficit(s), subacute Possible stroke EXAM: CT HEAD WITHOUT CONTRAST TECHNIQUE: Contiguous axial images were obtained from the base of the skull through the vertex without intravenous contrast. COMPARISON:  None. FINDINGS: Brain: No intracranial hemorrhage, mass effect, or midline shift. No hydrocephalus. The basilar cisterns are patent. No evidence of territorial infarct or acute ischemia. No extra-axial or intracranial fluid collection. Vascular: No hyperdense vessel or unexpected calcification. Skull: No fracture or focal lesion. Sinuses/Orbits: Paranasal sinuses and mastoid air cells are clear. The visualized orbits are unremarkable. Other: None. IMPRESSION: Unremarkable noncontrast head CT. No hemorrhage or evidence of acute ischemia. Electronically Signed   By: Narda RutherfordMelanie  Sanford  M.D.   On: 09/12/2019 19:15    ____________________________________________  PROCEDURES   Procedure(s) performed (including Critical Care):  Procedures  ____________________________________________  INITIAL IMPRESSION / MDM / [place] / ED COURSE  As part of my medical decision making, I reviewed the following data within the [place] was evaluated in Emergency Department on 09/12/2019 for the symptoms described in the history of present illness. She was evaluated in the context of the global COVID-19 pandemic, which necessitated consideration that the patient might be at risk for infection with the SARS-CoV-2 virus that causes COVID-19. Institutional protocols and algorithms that pertain to the evaluation of patients at risk for COVID-19 are in a state of rapid change based on information released by regulatory bodies including the CDC and federal and  state organizations. These policies and algorithms were followed during the patient's care in the ED.  Some ED evaluations and interventions may be delayed as a result of limited staffing during the pandemic.*      Medical Decision Making: 38 year old female here with left-sided weakness and transient speech difficulty.  Clinically, concern for small ischemic CVA.  No evidence of bleed on CT scan.  She is van negative.  I had tele-neurology evaluate the patient, who is in agreement.  Differential includes atypical migraine.  Following discussion of further testing and admission, the patient is adamant that she has to leave immediately.  She states she has things to do.  She is calm, and fully understands the risks of leaving.  She understands that she very well could have an acute stroke, which could worsen causing paralysis and death.  She understands the severe disability that could result from this, including death as well as severe morbidity, and she fully understands that by leaving now, we cannot prevent nor diagnosis.  I encouraged her to begin an aspirin and counseled her on tobacco cessation.  She will follow-up with a primary care doctor ideally, and I encouraged her to return immediately if she changes her mind. She demonstrates capacity and competency.   ____________________________________________  FINAL CLINICAL IMPRESSION(S) / ED DIAGNOSES  Final diagnoses:  Cerebrovascular accident (CVA), unspecified mechanism ([place])  Left against medical advice     MEDICATIONS GIVEN DURING THIS VISIT:  Medications  iohexol (OMNIPAQUE) 350 MG/ML injection 75 mL (has no administration in time range)     ED Discharge Orders    None       Note:  This document was prepared using Dragon voice recognition software and may include unintentional dictation errors.   Duffy Bruce, MD 09/12/19 2253

## 2019-09-12 NOTE — ED Notes (Signed)
Called lab to inquire why bloodwork not resulted yet. Lab denies being able to find any bloodwork from pt. Will send all blood tubes again.

## 2019-09-12 NOTE — Consult Note (Signed)
[facility] Consult Services  Stat Consult  Date of Service:   09/12/2019 22:07:26  Impression:     .  Rule Out Acute Ischemic Stroke  Comments/Sign-Out: she would benefit from MRI of the head to determine whether she is experiencing cerebrovascular ischemia or not. If she is then she would need a stroke evaluation. In the meantime I would start her on Plavix  CT HEAD: Showed No Acute Hemorrhage or Acute Core Infarct  Metrics: [facility] Notification Time: 09/12/2019 22:04:28 Stamp Time: 09/12/2019 22:07:26 Callback Response Time: 09/12/2019 22:08:02 Video Start Time: 09/12/2019 22:18:49 Video End Time: 09/12/2019 22:35:35  Our recommendations are outlined below.  Recommendations:     .  Initiate Plavix 75 MG Daily  Imaging Studies:     .  MRI Head  Disposition: Neurology Follow Up Recommended  Sign Out:     .  Discussed with Emergency Department Provider  ----------------------------------------------------------------------------------------------------  Chief Complaint: weakness  History of Present Illness: Patient is a 38 year old Female.  38 yo with onset of weakness of the left arm clumsiness and poor balance since around 2pm today. her symptoms are all gone but she still feels full in the head. no history of migraines.   Past Medical History:     . There is NO history of Hypertension     . There is NO history of Diabetes Mellitus     . There is NO history of Hyperlipidemia     . There is NO history of Coronary Artery Disease     . There is NO history of Stroke  Anticoagulant use:  No  Antiplatelet use: No Examination: BP(125/94), Pulse(80), Blood Glucose(90) 1A: Level of Consciousness - Alert; keenly responsive + 0 1B: Ask Month and Age - Both Questions Right + 0 1C: Blink Eyes & Squeeze Hands - Performs Both Tasks + 0 2: Test Horizontal Extraocular Movements - Normal + 0 3: Test Visual Fields - No Visual Loss + 0 4: Test  Facial Palsy (Use Grimace if Obtunded) - Normal symmetry + 0 5A: Test Left Arm Motor Drift - No Drift for 10 Seconds + 0 5B: Test Right Arm Motor Drift - No Drift for 10 Seconds + 0 6A: Test Left Leg Motor Drift - No Drift for 5 Seconds + 0 6B: Test Right Leg Motor Drift - No Drift for 5 Seconds + 0 7: Test Limb Ataxia (FNF/Heel-Shin) - No Ataxia + 0 8: Test Sensation - Mild-Moderate Loss: Less Sharp/More Dull + 1 9: Test Language/Aphasia - Normal; No aphasia + 0 10: Test Dysarthria - Normal + 0 11: Test Extinction/Inattention - No abnormality + 0  NIHSS Score: 1   Due to the immediate potential for life-threatening deterioration due to underlying acute neurologic illness, I spent 20 minutes providing critical care. This time includes time for face to face visit via telemedicine, review of medical records, imaging studies and discussion of findings with providers, the patient and/or family.   Dr Janean Sark   [facility] [phone]   Case [id number]

## 2019-09-12 NOTE — ED Notes (Signed)
Teleneuro staff on screen beginning communication with pt.

## 2019-09-12 NOTE — ED Notes (Signed)
Pt reports symptoms that sound similar to hypoglycemia/stroke/vertigo. Pt currently continues to c/o "fuzziness" in head and being off-balance.

## 2020-09-30 ENCOUNTER — Emergency Department: Payer: Self-pay | Admitting: Certified Registered"

## 2020-09-30 ENCOUNTER — Encounter: Payer: Self-pay | Admitting: Emergency Medicine

## 2020-09-30 ENCOUNTER — Other Ambulatory Visit: Payer: Self-pay

## 2020-09-30 ENCOUNTER — Observation Stay
Admission: EM | Admit: 2020-09-30 | Discharge: 2020-10-01 | Disposition: A | Discharge status: Home / Self Care | Payer: Self-pay | Attending: Obstetrics & Gynecology | Admitting: Obstetrics & Gynecology

## 2020-09-30 ENCOUNTER — Emergency Department: Payer: Self-pay

## 2020-09-30 ENCOUNTER — Encounter
Admission: EM | Disposition: A | Discharge status: Home / Self Care | Payer: Self-pay | Source: Home / Self Care | Attending: Student in an Organized Health Care Education/Training Program

## 2020-09-30 DIAGNOSIS — N7093 Salpingitis and oophoritis, unspecified: Secondary | ICD-10-CM | POA: Diagnosis present

## 2020-09-30 DIAGNOSIS — N83202 Unspecified ovarian cyst, left side: Principal | ICD-10-CM | POA: Insufficient documentation

## 2020-09-30 DIAGNOSIS — Z20822 Contact with and (suspected) exposure to covid-19: Secondary | ICD-10-CM | POA: Insufficient documentation

## 2020-09-30 DIAGNOSIS — N939 Abnormal uterine and vaginal bleeding, unspecified: Secondary | ICD-10-CM | POA: Insufficient documentation

## 2020-09-30 DIAGNOSIS — R102 Pelvic and perineal pain unspecified side: Secondary | ICD-10-CM | POA: Diagnosis present

## 2020-09-30 DIAGNOSIS — N730 Acute parametritis and pelvic cellulitis: Secondary | ICD-10-CM | POA: Diagnosis present

## 2020-09-30 DIAGNOSIS — F17210 Nicotine dependence, cigarettes, uncomplicated: Secondary | ICD-10-CM | POA: Insufficient documentation

## 2020-09-30 DIAGNOSIS — Z23 Encounter for immunization: Secondary | ICD-10-CM | POA: Insufficient documentation

## 2020-09-30 DIAGNOSIS — N738 Other specified female pelvic inflammatory diseases: Secondary | ICD-10-CM | POA: Insufficient documentation

## 2020-09-30 HISTORY — PX: LAPAROSCOPIC OVARIAN CYSTECTOMY: SHX6248

## 2020-09-30 LAB — WET PREP, GENITAL
Clue Cells Wet Prep HPF POC: NONE SEEN
Sperm: NONE SEEN
Trich, Wet Prep: NONE SEEN
Yeast Wet Prep HPF POC: NONE SEEN

## 2020-09-30 LAB — COMPREHENSIVE METABOLIC PANEL
ALT: 13 U/L (ref 0–44)
AST: 18 U/L (ref 15–41)
Albumin: 3.7 g/dL (ref 3.5–5.0)
Alkaline Phosphatase: 55 U/L (ref 38–126)
Anion gap: 8 (ref 5–15)
BUN: 7 mg/dL (ref 6–20)
CO2: 24 mmol/L (ref 22–32)
Calcium: 8.9 mg/dL (ref 8.9–10.3)
Chloride: 101 mmol/L (ref 98–111)
Creatinine, Ser: 0.64 mg/dL (ref 0.44–1.00)
GFR, Estimated: >60 mL/min (ref >60)
Glucose, Bld: 117 mg/dL — ABNORMAL HIGH (ref 70–99)
Potassium: 3.8 mmol/L (ref 3.5–5.1)
Sodium: 133 mmol/L — ABNORMAL LOW (ref 135–145)
Total Bilirubin: 0.5 mg/dL (ref 0.3–1.2)
Total Protein: 7.3 g/dL (ref 6.5–8.1)

## 2020-09-30 LAB — CBC WITH DIFFERENTIAL/PLATELET
Abs Immature Granulocytes: 0.05 10*3/uL (ref 0.00–0.07)
Basophils Absolute: 0 10*3/uL (ref 0.0–0.1)
Basophils Relative: 0 %
Eosinophils Absolute: 0.1 10*3/uL (ref 0.0–0.5)
Eosinophils Relative: 1 %
HCT: 28.1 % — ABNORMAL LOW (ref 36.0–46.0)
Hemoglobin: 9.1 g/dL — ABNORMAL LOW (ref 12.0–15.0)
Immature Granulocytes: 0 %
Lymphocytes Relative: 8 %
Lymphs Abs: 1 10*3/uL (ref 0.7–4.0)
MCH: 26.5 pg (ref 26.0–34.0)
MCHC: 32.4 g/dL (ref 30.0–36.0)
MCV: 81.9 fL (ref 80.0–100.0)
Monocytes Absolute: 0.7 10*3/uL (ref 0.1–1.0)
Monocytes Relative: 5 %
Neutro Abs: 10.8 10*3/uL — ABNORMAL HIGH (ref 1.7–7.7)
Neutrophils Relative %: 86 %
Platelets: 339 10*3/uL (ref 150–400)
RBC: 3.43 MIL/uL — ABNORMAL LOW (ref 3.87–5.11)
RDW: 14.5 % (ref 11.5–15.5)
WBC: 12.7 10*3/uL — ABNORMAL HIGH (ref 4.0–10.5)
nRBC: 0 % (ref 0.0–0.2)

## 2020-09-30 LAB — RESPIRATORY PANEL BY RT PCR (FLU A&B, COVID)
Influenza A by PCR: NEGATIVE
Influenza B by PCR: NEGATIVE
SARS Coronavirus 2 by RT PCR: NEGATIVE

## 2020-09-30 LAB — TYPE AND SCREEN
ABO/RH(D): A NEG
Antibody Screen: NEGATIVE

## 2020-09-30 LAB — HCG, QUANTITATIVE, PREGNANCY: hCG, Beta Chain, Quant, S: 1 m[IU]/mL (ref <5)

## 2020-09-30 LAB — CHLAMYDIA/NGC RT PCR (ARMC ONLY)
Chlamydia Tr: DETECTED — AB
N gonorrhoeae: DETECTED — AB

## 2020-09-30 SURGERY — EXCISION, CYST, OVARY, LAPAROSCOPIC
Anesthesia: General | Site: Abdomen | Laterality: Left

## 2020-09-30 MED ORDER — BISACODYL 10 MG RE SUPP: 10.0000 mg | Freq: Every day | RECTAL | Status: DC | PRN

## 2020-09-30 MED ORDER — ONDANSETRON HCL 4 MG PO TABS: 4.0000 mg | ORAL_TABLET | Freq: Four times a day (QID) | ORAL | Status: DC | PRN

## 2020-09-30 MED ORDER — METRONIDAZOLE 500 MG PO TABS: 500.0000 mg | ORAL_TABLET | Freq: Two times a day (BID) | ORAL | 0 refills | Status: AC

## 2020-09-30 MED ORDER — OXYCODONE HCL 5 MG/5ML PO SOLN: 5.0000 mg | Freq: Once | ORAL | Status: DC | PRN

## 2020-09-30 MED ORDER — PROPOFOL 10 MG/ML IV BOLUS
INTRAVENOUS | Status: AC
  Filled 2020-09-30: qty 20

## 2020-09-30 MED ORDER — MIDAZOLAM HCL 2 MG/2ML IJ SOLN
INTRAMUSCULAR | Status: DC | PRN
  Administered 2020-09-30: 2 mg via INTRAVENOUS

## 2020-09-30 MED ORDER — LIDOCAINE HCL (PF) 2 % IJ SOLN
INTRAMUSCULAR | Status: AC
  Filled 2020-09-30: qty 5

## 2020-09-30 MED ORDER — LACTATED RINGERS IV SOLN: INTRAVENOUS | Status: DC

## 2020-09-30 MED ORDER — PNEUMOCOCCAL VAC POLYVALENT 25 MCG/0.5ML IJ INJ
0.5000 mL | INJECTION | INTRAMUSCULAR | Status: DC
  Filled 2020-09-30 (×2): qty 0.5

## 2020-09-30 MED ORDER — SODIUM CHLORIDE 0.9 % IV SOLN: 1.0000 g | INTRAVENOUS | Status: DC

## 2020-09-30 MED ORDER — POVIDONE-IODINE 10 % EX SWAB: 2.0000 "application " | Freq: Once | CUTANEOUS | Status: DC

## 2020-09-30 MED ORDER — DEXMEDETOMIDINE (PRECEDEX) IN NS 20 MCG/5ML (4 MCG/ML) IV SYRINGE
PREFILLED_SYRINGE | INTRAVENOUS | Status: AC
  Filled 2020-09-30: qty 5

## 2020-09-30 MED ORDER — HYDROMORPHONE HCL 1 MG/ML IJ SOLN: 0.2000 mg | INTRAMUSCULAR | Status: DC | PRN

## 2020-09-30 MED ORDER — KETOROLAC TROMETHAMINE 30 MG/ML IJ SOLN
30.0000 mg | Freq: Four times a day (QID) | INTRAMUSCULAR | Status: DC
  Administered 2020-10-01 (×2): 30 mg via INTRAVENOUS
  Filled 2020-09-30 (×2): qty 1

## 2020-09-30 MED ORDER — SUCCINYLCHOLINE CHLORIDE 20 MG/ML IJ SOLN
INTRAMUSCULAR | Status: DC | PRN
  Administered 2020-09-30: 100 mg via INTRAVENOUS

## 2020-09-30 MED ORDER — SIMETHICONE 80 MG PO CHEW: 80.0000 mg | CHEWABLE_TABLET | Freq: Four times a day (QID) | ORAL | Status: DC | PRN

## 2020-09-30 MED ORDER — OXYCODONE-ACETAMINOPHEN 5-325 MG PO TABS
1.0000 | ORAL_TABLET | ORAL | Status: DC | PRN
  Administered 2020-09-30 – 2020-10-01 (×3): 1 via ORAL
  Filled 2020-09-30 (×3): qty 1

## 2020-09-30 MED ORDER — OXYCODONE HCL 5 MG PO TABS: 5.0000 mg | ORAL_TABLET | Freq: Once | ORAL | Status: DC | PRN

## 2020-09-30 MED ORDER — ROCURONIUM BROMIDE 100 MG/10ML IV SOLN
INTRAVENOUS | Status: DC | PRN
  Administered 2020-09-30: 20 mg via INTRAVENOUS
  Administered 2020-09-30: 50 mg via INTRAVENOUS

## 2020-09-30 MED ORDER — LIDOCAINE HCL (CARDIAC) PF 100 MG/5ML IV SOSY
PREFILLED_SYRINGE | INTRAVENOUS | Status: DC | PRN
  Administered 2020-09-30: 80 mg via INTRAVENOUS

## 2020-09-30 MED ORDER — IBUPROFEN 800 MG PO TABS: 800.0000 mg | ORAL_TABLET | Freq: Four times a day (QID) | ORAL | Status: DC

## 2020-09-30 MED ORDER — FENTANYL CITRATE (PF) 100 MCG/2ML IJ SOLN: 25.0000 ug | INTRAMUSCULAR | Status: DC | PRN

## 2020-09-30 MED ORDER — SENNOSIDES-DOCUSATE SODIUM 8.6-50 MG PO TABS: 1.0000 | ORAL_TABLET | Freq: Every evening | ORAL | Status: DC | PRN

## 2020-09-30 MED ORDER — FENTANYL CITRATE (PF) 250 MCG/5ML IJ SOLN
INTRAMUSCULAR | Status: AC
  Filled 2020-09-30: qty 5

## 2020-09-30 MED ORDER — DOXYCYCLINE HYCLATE 100 MG PO TABS
100.0000 mg | ORAL_TABLET | Freq: Two times a day (BID) | ORAL | Status: DC
  Filled 2020-09-30 (×2): qty 1

## 2020-09-30 MED ORDER — PROMETHAZINE HCL 25 MG/ML IJ SOLN: 12.5000 mg | Freq: Four times a day (QID) | INTRAMUSCULAR | Status: DC | PRN

## 2020-09-30 MED ORDER — KETAMINE HCL 50 MG/ML IJ SOLN
INTRAMUSCULAR | Status: AC
  Filled 2020-09-30: qty 10

## 2020-09-30 MED ORDER — DEXMEDETOMIDINE HCL 200 MCG/2ML IV SOLN
INTRAVENOUS | Status: DC | PRN
  Administered 2020-09-30: 8 ug via INTRAVENOUS
  Administered 2020-09-30: 12 ug via INTRAVENOUS

## 2020-09-30 MED ORDER — PROPOFOL 10 MG/ML IV BOLUS
INTRAVENOUS | Status: DC | PRN
  Administered 2020-09-30: 120 mg via INTRAVENOUS
  Administered 2020-09-30: 80 mg via INTRAVENOUS

## 2020-09-30 MED ORDER — SODIUM CHLORIDE 0.9 % IV SOLN
1.0000 g | INTRAVENOUS | Status: DC
  Filled 2020-09-30: qty 10

## 2020-09-30 MED ORDER — HYDROMORPHONE HCL 1 MG/ML IJ SOLN
0.5000 mg | INTRAMUSCULAR | Status: DC | PRN
  Administered 2020-09-30 (×2): 0.5 mg via INTRAVENOUS
  Filled 2020-09-30 (×3): qty 1

## 2020-09-30 MED ORDER — ONDANSETRON HCL 4 MG/2ML IJ SOLN: 4.0000 mg | Freq: Four times a day (QID) | INTRAMUSCULAR | Status: DC | PRN

## 2020-09-30 MED ORDER — ALBUTEROL SULFATE HFA 108 (90 BASE) MCG/ACT IN AERS
INHALATION_SPRAY | RESPIRATORY_TRACT | Status: DC | PRN
  Administered 2020-09-30: 10 via RESPIRATORY_TRACT

## 2020-09-30 MED ORDER — PROMETHAZINE HCL 25 MG/ML IJ SOLN: 6.2500 mg | INTRAMUSCULAR | Status: DC | PRN

## 2020-09-30 MED ORDER — DEXTROSE 5 % IV SOLN: 1000.0000 mg | Freq: Once | INTRAVENOUS | Status: DC

## 2020-09-30 MED ORDER — AZITHROMYCIN 500 MG PO TABS
1000.0000 mg | ORAL_TABLET | Freq: Once | ORAL | Status: AC
  Administered 2020-09-30: 1000 mg via ORAL
  Filled 2020-09-30: qty 2

## 2020-09-30 MED ORDER — LACTATED RINGERS IV SOLN: INTRAVENOUS | Status: DC | PRN

## 2020-09-30 MED ORDER — PHENYLEPHRINE HCL (PRESSORS) 10 MG/ML IV SOLN
INTRAVENOUS | Status: DC | PRN
  Administered 2020-09-30: 200 ug via INTRAVENOUS
  Administered 2020-09-30 (×3): 100 ug via INTRAVENOUS

## 2020-09-30 MED ORDER — MIDAZOLAM HCL 2 MG/2ML IJ SOLN
INTRAMUSCULAR | Status: AC
  Filled 2020-09-30: qty 2

## 2020-09-30 MED ORDER — KETAMINE HCL 50 MG/ML IJ SOLN
INTRAMUSCULAR | Status: DC | PRN
  Administered 2020-09-30: 50 mg via INTRAMUSCULAR

## 2020-09-30 MED ORDER — ONDANSETRON HCL 4 MG PO TABS: 4.0000 mg | ORAL_TABLET | Freq: Every day | ORAL | 0 refills | Status: DC | PRN

## 2020-09-30 MED ORDER — ONDANSETRON HCL 4 MG/2ML IJ SOLN
INTRAMUSCULAR | Status: DC | PRN
  Administered 2020-09-30: 4 mg via INTRAVENOUS

## 2020-09-30 MED ORDER — FENTANYL CITRATE (PF) 100 MCG/2ML IJ SOLN
INTRAMUSCULAR | Status: DC | PRN
  Administered 2020-09-30 (×3): 50 ug via INTRAVENOUS

## 2020-09-30 MED ORDER — DEXAMETHASONE SODIUM PHOSPHATE 10 MG/ML IJ SOLN
INTRAMUSCULAR | Status: DC | PRN
  Administered 2020-09-30: 10 mg via INTRAVENOUS

## 2020-09-30 MED ORDER — SODIUM CHLORIDE 0.9 % IV SOLN
1.0000 g | Freq: Once | INTRAVENOUS | Status: AC
  Administered 2020-09-30: 1 g via INTRAVENOUS
  Filled 2020-09-30: qty 10

## 2020-09-30 MED ORDER — INFLUENZA VAC SPLIT QUAD 0.5 ML IM SUSY
0.5000 mL | PREFILLED_SYRINGE | INTRAMUSCULAR | Status: AC
  Administered 2020-10-01: 0.5 mL via INTRAMUSCULAR
  Filled 2020-09-30: qty 0.5

## 2020-09-30 MED ORDER — DOXYCYCLINE HYCLATE 100 MG PO TABS: 100.0000 mg | ORAL_TABLET | Freq: Two times a day (BID) | ORAL | 0 refills | Status: AC

## 2020-09-30 MED ORDER — SUGAMMADEX SODIUM 200 MG/2ML IV SOLN
INTRAVENOUS | Status: DC | PRN
  Administered 2020-09-30: 200 mg via INTRAVENOUS

## 2020-09-30 MED ORDER — DEXAMETHASONE SODIUM PHOSPHATE 10 MG/ML IJ SOLN
INTRAMUSCULAR | Status: AC
  Filled 2020-09-30: qty 1

## 2020-09-30 MED ORDER — ONDANSETRON HCL 4 MG/2ML IJ SOLN
INTRAMUSCULAR | Status: AC
  Filled 2020-09-30: qty 2

## 2020-09-30 MED ORDER — KETOROLAC TROMETHAMINE 30 MG/ML IJ SOLN
30.0000 mg | Freq: Four times a day (QID) | INTRAMUSCULAR | Status: DC
  Administered 2020-09-30: 30 mg via INTRAVENOUS
  Filled 2020-09-30: qty 1

## 2020-09-30 MED ORDER — BUPIVACAINE HCL (PF) 0.5 % IJ SOLN
INTRAMUSCULAR | Status: DC | PRN
  Administered 2020-09-30: 14 mL

## 2020-09-30 MED ORDER — DOCUSATE SODIUM 100 MG PO CAPS
100.0000 mg | ORAL_CAPSULE | Freq: Two times a day (BID) | ORAL | Status: DC
  Administered 2020-09-30 – 2020-10-01 (×2): 100 mg via ORAL
  Filled 2020-09-30 (×2): qty 1

## 2020-09-30 MED ORDER — ACETAMINOPHEN 325 MG PO TABS
650.0000 mg | ORAL_TABLET | ORAL | Status: DC | PRN
  Administered 2020-09-30 – 2020-10-01 (×2): 650 mg via ORAL
  Filled 2020-09-30 (×2): qty 2

## 2020-09-30 SURGICAL SUPPLY — 45 items
BLADE SURG SZ11 CARB STEEL (BLADE) ×3 IMPLANT
CANISTER SUCT 1200ML W/VALVE (MISCELLANEOUS) ×3 IMPLANT
CATH ROBINSON RED A/P 16FR (CATHETERS) ×3 IMPLANT
CHLORAPREP W/TINT 26 (MISCELLANEOUS) ×3 IMPLANT
COVER WAND RF STERILE (DRAPES) IMPLANT
DERMABOND ADVANCED (GAUZE/BANDAGES/DRESSINGS) ×2
DERMABOND ADVANCED .7 DNX12 (GAUZE/BANDAGES/DRESSINGS) ×1 IMPLANT
DRSG TEGADERM 2-3/8X2-3/4 SM (GAUZE/BANDAGES/DRESSINGS) ×9 IMPLANT
DRSG TELFA 4X3 1S NADH ST (GAUZE/BANDAGES/DRESSINGS) ×3 IMPLANT
GAUZE 4X4 16PLY RFD (DISPOSABLE) ×3 IMPLANT
GLOVE BIO SURGEON STRL SZ8 (GLOVE) ×6 IMPLANT
GLOVE INDICATOR 8.0 STRL GRN (GLOVE) ×3 IMPLANT
GOWN STRL REUS W/ TWL LRG LVL3 (GOWN DISPOSABLE) ×1 IMPLANT
GOWN STRL REUS W/ TWL XL LVL3 (GOWN DISPOSABLE) ×1 IMPLANT
GOWN STRL REUS W/TWL LRG LVL3 (GOWN DISPOSABLE) ×2
GOWN STRL REUS W/TWL XL LVL3 (GOWN DISPOSABLE) ×2
GRASPER SUT TROCAR 14GX15 (MISCELLANEOUS) ×3 IMPLANT
IRRIGATION STRYKERFLOW (MISCELLANEOUS) ×1 IMPLANT
IRRIGATOR STRYKERFLOW (MISCELLANEOUS) ×3
IV LACTATED RINGERS 1000ML (IV SOLUTION) IMPLANT
IV NS 1000ML (IV SOLUTION) ×2
IV NS 1000ML BAXH (IV SOLUTION) ×1 IMPLANT
KIT PINK PAD W/HEAD ARE REST (MISCELLANEOUS) ×3
KIT PINK PAD W/HEAD ARM REST (MISCELLANEOUS) ×1 IMPLANT
LABEL OR SOLS (LABEL) ×3 IMPLANT
NEEDLE VERESS 14GA 120MM (NEEDLE) ×3 IMPLANT
NS IRRIG 500ML POUR BTL (IV SOLUTION) ×3 IMPLANT
PACK GYN LAPAROSCOPIC (MISCELLANEOUS) ×3 IMPLANT
PAD PREP 24X41 OB/GYN DISP (PERSONAL CARE ITEMS) ×3 IMPLANT
POUCH SPECIMEN RETRIEVAL 10MM (ENDOMECHANICALS) IMPLANT
SCISSORS METZENBAUM CVD 33 (INSTRUMENTS) ×3 IMPLANT
SET TUBE SMOKE EVAC HIGH FLOW (TUBING) ×3 IMPLANT
SHEARS HARMONIC ACE PLUS 36CM (ENDOMECHANICALS) ×3 IMPLANT
SLEEVE ENDOPATH XCEL 5M (ENDOMECHANICALS) ×3 IMPLANT
SOL PREP PROV IODINE SCRUB 4OZ (MISCELLANEOUS) ×3 IMPLANT
SPONGE GAUZE 2X2 8PLY STER LF (GAUZE/BANDAGES/DRESSINGS) ×3
SPONGE GAUZE 2X2 8PLY STRL LF (GAUZE/BANDAGES/DRESSINGS) ×6 IMPLANT
STRAP SAFETY 5IN WIDE (MISCELLANEOUS) ×3 IMPLANT
SUT VIC AB 0 CT1 36 (SUTURE) ×3 IMPLANT
SUT VIC AB 2-0 UR6 27 (SUTURE) IMPLANT
SUT VIC AB 4-0 PS2 18 (SUTURE) IMPLANT
SYR 10ML LL (SYRINGE) ×3 IMPLANT
SYSTEM WECK SHIELD CLOSURE (TROCAR) IMPLANT
TROCAR ENDO BLADELESS 11MM (ENDOMECHANICALS) ×3 IMPLANT
TROCAR XCEL NON-BLD 5MMX100MML (ENDOMECHANICALS) ×3 IMPLANT

## 2020-09-30 NOTE — Op Note (Signed)
  Operative Note   09/30/2020  PRE-OP DIAGNOSIS: Pelvic pain, Pelvic Inflammatory Disease, Ovarian Cyst (left sided)   POST-OP DIAGNOSIS: Pelvic Pain, PID, Tubo-Ovarian Abscess (left sided)   PROCEDURE: Procedure(s): LAPAROSCOPY; DRAINAGE OF LEFT TUBO-OVARIAN ABCESS; LYSIS OF ADHESIONS   SURGEON: Annamarie Major, MD, FACOG  ANESTHESIA: Choice   ESTIMATED BLOOD LOSS: Min  COMPLICATIONS: None  DISPOSITION: PACU - hemodynamically stable.  CONDITION: stable  FINDINGS: Laparoscopic survey of the abdomen revealed a grossly normal uterus, right tube, right ovary, liver edge, gallbladder edge and appendix.   Left tube and ovary was consistent with abscess. Intra-abdominal adhesions were noted with the omentum to anterior abdominal wall as well as surrounding left adnexa. There was purulent drainage from the left tubo-ovarian abscess.  It was lying on the posterior ovarian fossa with adhesions, overlying the ureter and vascular structures.  PROCEDURE IN DETAIL: The patient was taken to the OR where anesthesia was administed. The patient was positioned in dorsal lithotomy in the Portage stirrups. The patient was then examined under anesthesia with the above noted findings. The patient was prepped and draped in the normal sterile fashion and foley catheter was placed. A Graves speculum was placed in the vagina and the anterior lip of the cervix was grasped with a single toothed tenaculum. A Hulka uterine manipulator was then inserted in the uterus. Uterine mobility was found to be satisfactory. The speculum was then removed.  Bladder drained with a foley catheter.  Attention was turned to the patient's abdomen where a 5 mm skin incision was made in the umbilical fold, after injection of local anesthesia. The Veress step needle was carefully introduced into the peritoneal cavity with placement confirmed using the hanging drop technique.  Pneumoperitoneum was obtained. The 5 mm port was then placed under  direct visualization with the operative laparoscope.  Trendelenburg positioning.  Additional 5 mm trocar was then placed in the RLQ lateral to the inferior epigastric blood vessels under direct visualization with the laparoscope. An 11 mm trocar was placed in suprapubic region (using portion of prior cesarean scar).   Instrumentation to visualize complete pelvic anatomy performed.    Lysis of filmy adhesions is performed with the harmonic scapel with no bleeding or organ proximity discovered.   The left mass/ abscess is identified.  As it is stuck considerably on the left ovarian fossa overlying the ureter and vascular structures, an attempt to dissect free is not performed.  As there is significant inflammation, the concern for excising the left adnexa is high for complication, so is left in place for antibiotics to assist healing.  The abscess is punctured and drained as well as irrigated and aspirated.  The pelvis is also irrigated.  Hemostasis is assured at he abscess puncture area.  The larger suprapubic trocar is removed an dfascia closed with a 0 Vicryl suture.  Instruments and trocars removed, gas expelled, and skin closed with 4-0 Vicryl, and then skin adhesive glue.  Instrument, needle, and sponge counts correct x2 at the conclusion of the case.   Hluka tenaculum removed from cervix and foley also removed.  Pt goes to recovery room in stable condition.  Annamarie Major, MD, [facility], [facility] 09/30/2020  6:13 PM

## 2020-09-30 NOTE — H&P (Signed)
Obstetrics & Gynecology History and Physical Note  Date of Consultation: 09/30/2020   Requesting Provider: [facility]  Primary OBGYN: None Primary Care Provider: [facility]  Reason for Consultation: Pain  History of Present Illness: Ms. Jillian Gray is a 39 y.o. No obstetric history on file. (No LMP recorded.), with the above CC. She reports severe LLQ pain that started in the last 24 hours.  She has irreg cycles, and her bleeding is worse and on this weekend.  Pain does not radiate, has no other associated sx's, and and no modifiers; reports severe pain unless lies real still in fetal position.  ROS: A review of systems was performed and was complete and comprehensive, except as stated in the above HPI.  OBGYN History: As per HPI. OB History   G1P1, CS 18 yrs ago      Past Medical History: Past Medical History:  Diagnosis Date  . Anemia     Past Surgical History: Past Surgical History:  Procedure Laterality Date  . CESAREAN SECTION      Family History:  History reviewed. No pertinent family history. She denies any female cancers, bleeding or blood clotting disorders.   Social History:  Social History   Socioeconomic History  . Marital status: Single    Spouse name: Not on file  . Number of children: Not on file  . Years of education: Not on file  . Highest education level: Not on file  Occupational History  . Not on file  Tobacco Use  . Smoking status: Current Every Day Smoker    Packs/day: 0.50    Types: Cigarettes  . Smokeless tobacco: Never Used  Substance and Sexual Activity  . Alcohol use: No  . Drug use: Not on file  . Sexual activity: Not on file  Other Topics Concern  . Not on file  Social History Narrative  . Not on file   Social Determinants of Health   Financial Resource Strain:   . Difficulty of Paying Living Expenses: Not on file  Food Insecurity:   . Worried About Programme researcher, broadcasting/film/video in the Last Year: Not on file  .  Ran Out of Food in the Last Year: Not on file  Transportation Needs:   . Lack of Transportation (Medical): Not on file  . Lack of Transportation (Non-Medical): Not on file  Physical Activity:   . Days of Exercise per Week: Not on file  . Minutes of Exercise per Session: Not on file  Stress:   . Feeling of Stress : Not on file  Social Connections:   . Frequency of Communication with Friends and Family: Not on file  . Frequency of Social Gatherings with Friends and Family: Not on file  . Attends Religious Services: Not on file  . Active Member of Clubs or Organizations: Not on file  . Attends Banker Meetings: Not on file  . Marital Status: Not on file  Intimate Partner Violence:   . Fear of Current or Ex-Partner: Not on file  . Emotionally Abused: Not on file  . Physically Abused: Not on file  . Sexually Abused: Not on file    Allergy: Allergies  Allergen Reactions  . Penicillins Other (See Comments)    Jillian Gray had anaphylactic reaction, she has never taken it.     Current Outpatient Medications: (Not in a hospital admission)   Hospital Medications: Current Facility-Administered Medications  Medication Dose Route Frequency Provider Last Rate Last Admin  . cefTRIAXone (ROCEPHIN)  1 g in sodium chloride 0.9 % 100 mL IVPB  1 g Intravenous Once Willy Eddy, MD 200 mL/hr at 09/30/20 1548 1 g at 09/30/20 1548  . HYDROmorphone (DILAUDID) injection 0.5 mg  0.5 mg Intravenous Q2H PRN Nadara Mustard, MD   0.5 mg at 09/30/20 1226   Current Outpatient Medications  Medication Sig Dispense Refill  . cyclobenzaprine (FLEXERIL) 5 MG tablet Take 1 tablet (5 mg total) by mouth 3 (three) times daily as needed for muscle spasms. 15 tablet 0  . doxycycline (VIBRA-TABS) 100 MG tablet Take 1 tablet (100 mg total) by mouth 2 (two) times daily for 7 days. 14 tablet 0  . metroNIDAZOLE (FLAGYL) 500 MG tablet Take 1 tablet (500 mg total) by mouth 2 (two) times daily for 7 days. 21  tablet 0  . ondansetron (ZOFRAN) 4 MG tablet Take 1 tablet (4 mg total) by mouth daily as needed. 14 tablet 0    Physical Exam: Vitals:   09/30/20 1030 09/30/20 1032 09/30/20 1612  BP:  (!) 143/87 122/82  Pulse:  98 72  Resp:  18 18  Temp:  98 F (36.7 C) 98.7 F (37.1 C)  TempSrc:  Oral Oral  SpO2:  97% 99%  Weight: 79.4 kg    Height: 5\' 7"  (1.702 m)      Temp:  [98 F (36.7 C)-98.7 F (37.1 C)] 98.7 F (37.1 C) (10/16 1612) Pulse Rate:  [72-98] 72 (10/16 1612) Resp:  [18] 18 (10/16 1612) BP: (122-143)/(82-87) 122/82 (10/16 1612) SpO2:  [97 %-99 %] 99 % (10/16 1612) Weight:  [79.4 kg] 79.4 kg (10/16 1030) No intake/output data recorded. No intake/output data recorded. No intake or output data in the 24 hours ending 09/30/20 1613  Body mass index is 27.41 kg/m. Constitutional: Well nourished, well developed female in no acute distress.  HEENT: normal Neck:  Supple, normal appearance, and no thyromegaly  Cardiovascular:Regular rate and rhythm.  No murmurs, rubs or gallops. Respiratory:  Clear to auscultation bilateral. Normal respiratory effort Abdomen: positive bowel sounds and no masses, hernias; diffusely non tender to palpation, non distended Neuro: grossly intact Psych:  Normal mood and affect.  Skin:  Warm and dry.  MS: normal gait and normal bilateral lower extremity strength/ROM/symmetry Lymphatic:  No inguinal lymphadenopathy.   Recent Labs  Lab 09/30/20 1059  WBC 12.7*  HGB 9.1*  HCT 28.1*  PLT 339   Recent Labs  Lab 09/30/20 1059  NA 133*  K 3.8  CL 101  CO2 24  BUN 7  CREATININE 0.64  CALCIUM 8.9  PROT 7.3  BILITOT 0.5  ALKPHOS 55  ALT 13  AST 18  GLUCOSE 117*   No results for input(s): APTT, INR, PTT in the last 168 hours.  Invalid input(s): DRHAPTT Recent Labs  Lab 09/30/20 1059  ABORH A NEG    Imaging:  Ultrasound independently reviewed/interpreted by self.  Assessment: Ms. Jillian Gray is a 39 y.o. No obstetric history on  file. (No LMP recorded.) who presented to the ED with complaints of LLQ pain; findings are consistent with both large left ovarian cyst as well as PID w positive cultures fro GC and Chl as well as CMT reported by ER MD exam..  Plan: Discussed options for treatment for both, individually and as a unit.   The pain could individually be from either, but the pain is acute onset and severe,and this could be due to ovarian torsion perhaps, or from PID.   As she feels her  compliance after discharge would be poor, and due to the severity of her pain, inpatient management is best, and have discussed laparoscopy for cystectomy to take care fo that etiology and concern.  She prefers this surgical approach.  She will need ABX now and ongoing (14 days) after discharge even if feels better after surgery, as PID can have other long term consequences.  Informed consent obtained for surgery.  The risks of  surgery discussed with the patient included but were not limited to: bleeding which may require transfusion or reoperation; infection which may require antibiotics; injury to bowel, bladder, ureters or other surrounding organs; need for additional procedures including hysterectomy in the event of a life-threatening hemorrhage; incisional problems, thromboembolic phenomenon and other postoperative/anesthesia complications. The patient concurred with the proposed plan, giving informed written consent for the procedure.   Rocephin and Azithromycin IV now.  Plan Doxycycline post op 14 days.  Plan to observe overnight for pain management, compliance concerns, and as she has no one to be with her after surgery tonight.  Jillian Major, MD, [facility], [facility] 09/30/2020  4:13 PM Pager [phone]

## 2020-09-30 NOTE — ED Provider Notes (Signed)
[facility] Emergency Department Provider Note    First MD Initiated Contact with Patient 09/30/20 1057     (approximate)  I have reviewed the triage vital signs and the nursing notes.   HISTORY  Chief Complaint Vaginal Bleeding and Abdominal Pain    HPI Jillian Gray is a 39 y.o. female presents to the ER for evaluation of pelvic cramping predictably on the left side associated with heavy vaginal bleeding for the past 24 hours.  Does have some irregular menses.  Also has a history of ovarian cyst.  Has not had to have surgery to remove cyst.  Denies any measured fevers.  No dysuria.  Denies any flank pain chest pain or shortness of breath.    Past Medical History:  Diagnosis Date   Anemia    History reviewed. No pertinent family history. Past Surgical History:  Procedure Laterality Date   CESAREAN SECTION     There are no problems to display for this patient.     Prior to Admission medications   Medication Sig Start Date End Date Taking? Authorizing Provider  cyclobenzaprine (FLEXERIL) 5 MG tablet Take 1 tablet (5 mg total) by mouth 3 (three) times daily as needed for muscle spasms. 05/04/19   Menshew, Charlesetta Ivory, PA-C  doxycycline (VIBRA-TABS) 100 MG tablet Take 1 tablet (100 mg total) by mouth 2 (two) times daily for 7 days. 09/30/20 10/07/20  Willy Eddy, MD  metroNIDAZOLE (FLAGYL) 500 MG tablet Take 1 tablet (500 mg total) by mouth 2 (two) times daily for 7 days. 09/30/20 10/07/20  Willy Eddy, MD  ondansetron (ZOFRAN) 4 MG tablet Take 1 tablet (4 mg total) by mouth daily as needed. 09/30/20 09/30/21  Willy Eddy, MD    Allergies Penicillins    Social History Social History   Tobacco Use   Smoking status: Current Every Day Smoker    Packs/day: 0.50    Types: Cigarettes   Smokeless tobacco: Never Used  Substance Use Topics   Alcohol use: No   Drug use: Not on file    Review of Systems Patient denies  headaches, rhinorrhea, blurry vision, numbness, shortness of breath, chest pain, edema, cough, abdominal pain, nausea, vomiting, diarrhea, dysuria, fevers, rashes or hallucinations unless otherwise stated above in HPI. ____________________________________________   PHYSICAL EXAM:  VITAL SIGNS: Vitals:   09/30/20 1032  BP: (!) 143/87  Pulse: 98  Resp: 18  Temp: 98 F (36.7 C)  SpO2: 97%    Constitutional: Alert and oriented.  Eyes: Conjunctivae are normal.  Head: Atraumatic. Nose: No congestion/rhinnorhea. Mouth/Throat: Mucous membranes are moist.   Neck: No stridor. Painless ROM.  Cardiovascular: Normal rate, regular rhythm. Grossly normal heart sounds.  Good peripheral circulation. Respiratory: Normal respiratory effort.  No retractions. Lungs CTAB. Gastrointestinal: Soft and nontender. No distention. No abdominal bruits. No CVA tenderness. Genitourinary: There is cervical motion tenderness on exam with moderate amount of bleeding and clot in the vaginal vault. Cervix with slow ooze. Does have predominantly left adnexal tenderness to palpation. Musculoskeletal: No lower extremity tenderness nor edema.  No joint effusions. Neurologic:  Normal speech and language. No gross focal neurologic deficits are appreciated. No facial droop Skin:  Skin is warm, dry and intact. No rash noted. Psychiatric: Mood and affect are normal. Speech and behavior are normal.  ____________________________________________   LABS (all labs ordered are listed, but only abnormal results are displayed)  Results for orders placed or performed during the hospital encounter of 09/30/20 (from the  past 24 hour(s))  CBC with Differential     Status: Abnormal   Collection Time: 09/30/20 10:59 AM  Result Value Ref Range   WBC 12.7 (H) 4.0 - 10.5 K/uL   RBC 3.43 (L) 3.87 - 5.11 MIL/uL   Hemoglobin 9.1 (L) 12.0 - 15.0 g/dL   HCT 76.2 (L) 36 - 46 %   MCV 81.9 80.0 - 100.0 fL   MCH 26.5 26.0 - 34.0 pg   MCHC  32.4 30.0 - 36.0 g/dL   RDW 83.1 51.7 - 61.6 %   Platelets 339 150 - 400 K/uL   nRBC 0.0 0.0 - 0.2 %   Neutrophils Relative % 86 %   Neutro Abs 10.8 (H) 1.7 - 7.7 K/uL   Lymphocytes Relative 8 %   Lymphs Abs 1.0 0.7 - 4.0 K/uL   Monocytes Relative 5 %   Monocytes Absolute 0.7 0.1 - 1.0 K/uL   Eosinophils Relative 1 %   Eosinophils Absolute 0.1 0.0 - 0.5 K/uL   Basophils Relative 0 %   Basophils Absolute 0.0 0.0 - 0.1 K/uL   Immature Granulocytes 0 %   Abs Immature Granulocytes 0.05 0.00 - 0.07 K/uL  Comprehensive metabolic panel     Status: Abnormal   Collection Time: 09/30/20 10:59 AM  Result Value Ref Range   Sodium 133 (L) 135 - 145 mmol/L   Potassium 3.8 3.5 - 5.1 mmol/L   Chloride 101 98 - 111 mmol/L   CO2 24 22 - 32 mmol/L   Glucose, Bld 117 (H) 70 - 99 mg/dL   BUN 7 6 - 20 mg/dL   Creatinine, Ser 0.73 0.44 - 1.00 mg/dL   Calcium 8.9 8.9 - 71.0 mg/dL   Total Protein 7.3 6.5 - 8.1 g/dL   Albumin 3.7 3.5 - 5.0 g/dL   AST 18 15 - 41 U/L   ALT 13 0 - 44 U/L   Alkaline Phosphatase 55 38 - 126 U/L   Total Bilirubin 0.5 0.3 - 1.2 mg/dL   GFR, Estimated >62 >69 mL/min   Anion gap 8 5 - 15  Type and screen [facility]     Status: None   Collection Time: 09/30/20 10:59 AM  Result Value Ref Range   ABO/RH(D) A NEG    Antibody Screen NEG    Sample Expiration      10/03/2020,2359 Performed at [facility], [address] [place], [place] [address]   hCG, quantitative, pregnancy     Status: None   Collection Time: 09/30/20 10:59 AM  Result Value Ref Range   hCG, Beta Chain, Quant, S 1 <5 mIU/mL  Wet prep, genital     Status: Abnormal   Collection Time: 09/30/20 12:41 PM  Result Value Ref Range   Yeast Wet Prep HPF POC NONE SEEN NONE SEEN   Trich, Wet Prep NONE SEEN NONE SEEN   Clue Cells Wet Prep HPF POC NONE SEEN NONE SEEN   WBC, Wet Prep HPF POC FEW (A) NONE SEEN   Sperm NONE SEEN   Chlamydia/NGC rt PCR ([facility] only)     Status: Abnormal    Collection Time: 09/30/20 12:41 PM   Specimen: Cervical/Vaginal swab  Result Value Ref Range   Specimen source GC/Chlam CHLAMYDIA SPECIES    Chlamydia Tr DETECTED (A) NOT DETECTED   N gonorrhoeae DETECTED (A) NOT DETECTED   ____________________________________________ ____________________________________________  RADIOLOGY  I personally reviewed all radiographic images ordered to evaluate for the above acute complaints and reviewed  radiology reports and findings.  These findings were personally discussed with the patient.  Please see medical record for radiology report.  ____________________________________________   PROCEDURES  Procedure(s) performed:  Procedures    Critical Care performed: no ____________________________________________   INITIAL IMPRESSION / ASSESSMENT AND PLAN / ED COURSE  Pertinent labs & imaging results that were available during my care of the patient were reviewed by me and considered in my medical decision making (see chart for details).   DDX: Sepsis, PID, UTI, stone, torsion, TOA, colitis  Jillian Gray is a 39 y.o. who presents to the ED with presentation as described above. Patient uncomfortable appearing. Pregnancy is negative. Not consistent with ectopic but given her history blood work and imaging we are formed. Will perform pelvic exam with chaperone. Will provide IV narcotic medication for pain control.  Clinical Course as of Oct 01 1535  Sat Sep 30, 2020  1240 Patient's pelvic exam with blood and clot and the vaginal vault.  Cervix appears closed no active hemorrhage from the cervix, slight ooze currently no other source of bleeding currently. + CMT. Seems to be more tender on the left adnexa.   [PR]  1453 Ultrasound without any evidence of torsion or abscess.  Her cervical swabs did test positive for GC and chlamydia.  Her presentation seems consistent with acute PID.  Given complex cyst will consult with OB/GYN will treat for PID with  antibiotics.   [PR]  1512 Discussed case in consultation with Dr. Tiburcio Pea of OB/GYN who will evaluate patient at bedside   [PR]    Clinical Course User Index [PR] Willy Eddy, MD    The patient was evaluated in Emergency Department today for the symptoms described in the history of present illness. He/she was evaluated in the context of the global COVID-19 pandemic, which necessitated consideration that the patient might be at risk for infection with the SARS-CoV-2 virus that causes COVID-19. Institutional protocols and algorithms that pertain to the evaluation of patients at risk for COVID-19 are in a state of rapid change based on information released by regulatory bodies including the CDC and federal and state organizations. These policies and algorithms were followed during the patient's care in the ED.  As part of my medical decision making, I reviewed the following data within the electronic MEDICAL RECORD NUMBER Nursing notes reviewed and incorporated, Labs reviewed, notes from prior ED visits and [place] Controlled Substance Database   ____________________________________________   FINAL CLINICAL IMPRESSION(S) / ED DIAGNOSES  Final diagnoses:  Pelvic pain  Vaginal bleeding      NEW MEDICATIONS STARTED DURING THIS VISIT:  New Prescriptions   DOXYCYCLINE (VIBRA-TABS) 100 MG TABLET    Take 1 tablet (100 mg total) by mouth 2 (two) times daily for 7 days.   METRONIDAZOLE (FLAGYL) 500 MG TABLET    Take 1 tablet (500 mg total) by mouth 2 (two) times daily for 7 days.   ONDANSETRON (ZOFRAN) 4 MG TABLET    Take 1 tablet (4 mg total) by mouth daily as needed.     Note:  This document was prepared using Dragon voice recognition software and may include unintentional dictation errors.    Willy Eddy, MD 09/30/20 1536

## 2020-09-30 NOTE — ED Triage Notes (Signed)
Pt via POV from home. Pt c/o lower abdominal pain and vaginal bleeding. Pt states that she has been passing huge blood clots. Pt hx of ruptured ovarian cyst. Pt is A&Ox4 and very uncomfortable in triage.

## 2020-09-30 NOTE — Anesthesia Procedure Notes (Signed)
Procedure Name: Intubation Date/Time: 09/30/2020 5:01 PM Performed by: Katherine Basset, CRNA Pre-anesthesia Checklist: Patient identified, Emergency Drugs available, Suction available and Patient being monitored Patient Re-evaluated:Patient Re-evaluated prior to induction Oxygen Delivery Method: Circle system utilized Preoxygenation: Pre-oxygenation with 100% oxygen Induction Type: IV induction Ventilation: Mask ventilation without difficulty Laryngoscope Size: Miller and 2 Grade View: Grade I Tube type: Oral Tube size: 7.5 mm Number of attempts: 1 Airway Equipment and Method: Stylet and Oral airway Placement Confirmation: ETT inserted through vocal cords under direct vision,  positive ETCO2 and breath sounds checked- equal and bilateral Secured at: 21 cm Tube secured with: Tape Dental Injury: Teeth and Oropharynx as per pre-operative assessment

## 2020-09-30 NOTE — Discharge Instructions (Signed)
Pelvic Inflammatory Disease  Pelvic inflammatory disease (PID) is caused by an infection in some or all of the female reproductive organs. The infection can be in the uterus, ovaries, fallopian tubes, or the surrounding tissues in the pelvis. PID can cause abdominal or pelvic pain that comes on suddenly (acute pelvic pain). PID is a serious infection because it can lead to lasting (chronic) pelvic pain or the inability to have children (infertility). What are the causes? This condition is most often caused by bacteria that is spread during sexual contact. It can also be caused by a bacterial infection of the vagina (bacterial vaginosis) that is not spread by sexual contact. This condition occurs when the infection is not treated and the bacteria travel upward from the vagina or cervix into the reproductive organs. Bacteria may also be introduced into the reproductive organs following:  The birth of a baby.  A miscarriage.  An abortion.  Major pelvic surgery.  The insertion of an intrauterine device (IUD).  A sexual assault. What increases the risk? You are more likely to develop this condition if you:  Are younger than 39 years of age.  Are sexually active at a young age.  Have a history of STI (sexually transmitted infection) or PID.  Do not regularly use barrier contraception methods, such as condoms.  Have multiple sexual partners.  Have sex with someone who has symptoms of an STI.  Use a vaginal douche.  Have recently had an IUD inserted. What are the signs or symptoms? Symptoms of this condition include:  Abdominal or pelvic pain.  Fever.  Chills.  Abnormal vaginal discharge.  Abnormal uterine bleeding.  Unusual pain shortly after the end of a menstrual period.  Painful urination.  Pain with sex.  Nausea and vomiting. How is this diagnosed? This condition is diagnosed based on a pelvic exam and medical history. A pelvic exam can reveal signs of  infection, inflammation, and discharge in the vagina and the surrounding tissues. It can also help to identify painful areas. You may also have tests, such as:  Lab tests, including a pregnancy test, blood tests, and a urine test.  Culture tests of the vagina and cervix to check for an STI.  Ultrasound.  A laparoscopic procedure to look inside the pelvis.  Examination of vaginal discharge under a microscope. How is this treated? This condition may be treated with:  Antibiotic medicines taken by mouth (orally). For more severe cases, antibiotics may be given through an IV at the hospital.  Surgery. This is rare. Surgery may be needed if other treatments do not help.  Efforts to stop the spread of the infection. Sexual partners may need to be treated if the infection is caused by an STI. It may take weeks until you are completely well. If you are diagnosed with PID, you should also be checked for HIV (human immunodeficiency virus). Your health care provider may test you for infection again 3 months after treatment. You should not have unprotected sex. Follow these instructions at home:  Take over-the-counter and prescription medicines only as told by your health care provider.  If you were prescribed an antibiotic medicine, take it as told by your health care provider. Do not stop using the antibiotic even if you start to feel better.  Do not have sex until treatment is completed or as told by your health care provider. If PID is confirmed, your recent sexual partners will need treatment, especially if you had unprotected sex.  Keep all   follow-up visits as told by your health care provider. This is important. Contact a health care provider if:  You have increased or abnormal vaginal discharge.  Your pain does not improve.  You vomit.  You have a fever.  You cannot tolerate your medicines.  Your partner has an STI.  You have pain when you urinate. Get help right away if:   You have increased abdominal or pelvic pain.  You have chills.  Your symptoms are not better in 72 hours with treatment. Summary  Pelvic inflammatory disease (PID) is caused by an infection in some or all of the female reproductive organs.  PID is a serious infection because it can lead to lasting (chronic) pelvic pain or the inability to have children (infertility).  This infection is usually treated with antibiotic medicines.  Do not have sex until treatment is completed or as told by your health care provider. This information is not intended to replace advice given to you by your health care provider. Make sure you discuss any questions you have with your health care provider. Document Revised: 08/20/2018 Document Reviewed: 08/25/2018 Elsevier Patient Education  2020 [employer]  

## 2020-09-30 NOTE — Anesthesia Preprocedure Evaluation (Signed)
Anesthesia Evaluation  Patient identified by MRN, date of birth, ID band Patient awake    Reviewed: Allergy & Precautions, H&P , NPO status , Patient's Chart, lab work & pertinent test results  History of Anesthesia Complications Negative for: history of anesthetic complications  Airway Mallampati: II  TM Distance: >3 FB     Dental  (+) Poor Dentition, Chipped, Missing   Pulmonary neg sleep apnea, neg COPD, Current SmokerPatient did not abstain from smoking.,     + wheezing      Cardiovascular (-) angina(-) Past MI and (-) Cardiac Stents negative cardio ROS  (-) dysrhythmias  Rhythm:regular Rate:Normal     Neuro/Psych negative neurological ROS  negative psych ROS   GI/Hepatic negative GI ROS, Neg liver ROS,   Endo/Other  negative endocrine ROS  Renal/GU      Musculoskeletal   Abdominal   Peds  Hematology negative hematology ROS (+)   Anesthesia Other Findings Past Medical History: No date: Anemia  Past Surgical History: No date: CESAREAN SECTION  BMI    Body Mass Index: 27.41 kg/m      Reproductive/Obstetrics negative OB ROS                             Anesthesia Physical Anesthesia Plan  ASA: II  Anesthesia Plan: General ETT   Post-op Pain Management:    Induction:   PONV Risk Score and Plan: Ondansetron, Dexamethasone, Midazolam and Treatment may vary due to age or medical condition  Airway Management Planned:   Additional Equipment:   Intra-op Plan:   Post-operative Plan:   Informed Consent: I have reviewed the patients History and Physical, chart, labs and discussed the procedure including the risks, benefits and alternatives for the proposed anesthesia with the patient or authorized representative who has indicated his/her understanding and acceptance.     Dental Advisory Given  Plan Discussed with: Anesthesiologist, CRNA and Surgeon  Anesthesia Plan  Comments:         Anesthesia Quick Evaluation

## 2020-09-30 NOTE — Anesthesia Postprocedure Evaluation (Signed)
Anesthesia Post Note  Patient: Jacayla Nordell Gil  Procedure(s) Performed: LAPAROSCOPY; DRAINAGE OF LEFT TUBO-OVARIAN ABCESS; LYSIS OF ADHESIONS (Left Abdomen)  Patient location during evaluation: PACU Anesthesia Type: General Level of consciousness: awake and alert Pain management: pain level controlled Vital Signs Assessment: post-procedure vital signs reviewed and stable Respiratory status: spontaneous breathing, nonlabored ventilation and respiratory function stable Cardiovascular status: blood pressure returned to baseline and stable Postop Assessment: no apparent nausea or vomiting Anesthetic complications: no   No complications documented.   Last Vitals:  Vitals:   09/30/20 1849 09/30/20 1904  BP: 111/77 116/71  Pulse: 99 (!) 104  Resp: (!) 22 17  Temp:    SpO2: 98% 97%    Last Pain:  Vitals:   09/30/20 1904  TempSrc:   PainSc: 1                  Karleen Hampshire

## 2020-09-30 NOTE — Transfer of Care (Signed)
Immediate Anesthesia Transfer of Care Note  Patient: Jillian Gray  Procedure(s) Performed: LAPAROSCOPY; DRAINAGE OF LEFT TUBO-OVARIAN ABCESS; LYSIS OF ADHESIONS (Left Abdomen)  Patient Location: PACU  Anesthesia Type:General  Level of Consciousness: drowsy  Airway & Oxygen Therapy: Patient Spontanous Breathing and Patient connected to face mask oxygen  Post-op Assessment: Report given to RN and Post -op Vital signs reviewed and stable  Post vital signs: Reviewed and stable  Last Vitals:  Vitals Value Taken Time  BP 116/74 09/30/20 1819  Temp 37.6 C 09/30/20 1819  Pulse 108 09/30/20 1831  Resp 20 09/30/20 1831  SpO2 100 % 09/30/20 1831  Vitals shown include unvalidated device data.  Last Pain:  Vitals:   09/30/20 1819  TempSrc:   PainSc: Asleep         Complications: No complications documented.

## 2020-10-01 ENCOUNTER — Encounter: Payer: Self-pay | Admitting: Obstetrics & Gynecology

## 2020-10-01 LAB — CBC
HCT: 26.2 % — ABNORMAL LOW (ref 36.0–46.0)
Hemoglobin: 8.5 g/dL — ABNORMAL LOW (ref 12.0–15.0)
MCH: 26.7 pg (ref 26.0–34.0)
MCHC: 32.4 g/dL (ref 30.0–36.0)
MCV: 82.4 fL (ref 80.0–100.0)
Platelets: 356 10*3/uL (ref 150–400)
RBC: 3.18 MIL/uL — ABNORMAL LOW (ref 3.87–5.11)
RDW: 14.5 % (ref 11.5–15.5)
WBC: 17.5 10*3/uL — ABNORMAL HIGH (ref 4.0–10.5)
nRBC: 0 % (ref 0.0–0.2)

## 2020-10-01 MED ORDER — DOXYCYCLINE HYCLATE 100 MG PO TABS: 100.0000 mg | ORAL_TABLET | Freq: Two times a day (BID) | ORAL | 0 refills | Status: AC

## 2020-10-01 MED ORDER — SODIUM CHLORIDE 0.9 % IV SOLN
1.0000 g | Freq: Two times a day (BID) | INTRAVENOUS | Status: DC
  Administered 2020-10-01 (×2): 1 g via INTRAVENOUS
  Filled 2020-10-01 (×4): qty 1

## 2020-10-01 MED ORDER — IBUPROFEN 800 MG PO TABS
800.0000 mg | ORAL_TABLET | Freq: Four times a day (QID) | ORAL | 0 refills | Status: DC
Start: 2020-10-01 — End: 2021-04-04

## 2020-10-01 NOTE — Progress Notes (Signed)
Pt discharged home.  Discharge instructions, prescriptions and follow up appointment given to and reviewed with pt.  Pt verbalized understanding.  Escorted by auxillary. 

## 2020-10-01 NOTE — Discharge Summary (Signed)
Gynecology Physician Postoperative Discharge Summary  Patient ID: Jillian Gray MRN: [id number] DOB/AGE: 1981-09-02 39 y.o.  Admit Date: 09/30/2020 Discharge Date: 10/01/2020  Preoperative Diagnoses: PID, Pelvic pain  Procedures: Procedure(s) (LRB): LAPAROSCOPY; DRAINAGE OF LEFT TUBO-OVARIAN ABCESS; LYSIS OF ADHESIONS (Left)  Significant Labs: CBC Latest Ref Rng & Units 10/01/2020 09/30/2020 09/12/2019  WBC 4.0 - 10.5 K/uL 17.5(H) 12.7(H) 7.6  Hemoglobin 12.0 - 15.0 g/dL 8.4(Z) 6.6(A) 63.0  Hematocrit 36 - 46 % 26.2(L) 28.1(L) 39.7  Platelets 150 - 400 K/uL 356 339 351    Hospital Course:  Jillian Gray is a 39 y.o. No obstetric history on file.  admitted for scheduled surgery.  She underwent the procedures as mentioned above, her operation was uncomplicated. For further details about surgery, please refer to the operative report. Patient had an uncomplicated postoperative course. By time of discharge on POD#1, her pain was controlled on oral pain medications; she was ambulating, voiding without difficulty, tolerating regular diet and passing flatus. No fever, and much improved pain.  She was deemed stable for discharge to home.   Discharge Exam: Blood pressure 114/79, pulse 89, temperature 98.3 F (36.8 C), temperature source Oral, resp. rate 17, height 5\' 7"  (1.702 m), weight 79.4 kg, SpO2 98 %. General appearance: alert and no distress  Resp: clear to auscultation bilaterally  Cardio: regular rate and rhythm  GI: soft, min-tender; bowel sounds normal; no masses, no organomegaly.  Incision: C/D/I, no erythema, no drainage noted Pelvic: scant blood on pad  Extremities: extremities normal, atraumatic, no cyanosis or edema and Homans sign is negative, no sign of DVT  Discharged Condition: Stable  Disposition: Discharge disposition: 01-Home or Self Care       Discharge Instructions    Call MD for:  persistant nausea and vomiting   Complete by: As directed    Call MD for:   redness, tenderness, or signs of infection (pain, swelling, redness, odor or green/yellow discharge around incision site)   Complete by: As directed    Call MD for:  severe uncontrolled pain   Complete by: As directed    Call MD for:  temperature >100.4   Complete by: As directed    Diet - low sodium heart healthy   Complete by: As directed    Discharge instructions   Complete by: As directed    Resume activities according to discharge instruction sheets   Increase activity slowly   Complete by: As directed      Allergies as of 10/01/2020      Reactions   Penicillins Other (See Comments)   Dad had anaphylactic reaction, she has never taken it.       Medication List    TAKE these medications   cyclobenzaprine 5 MG tablet Commonly known as: FLEXERIL Take 1 tablet (5 mg total) by mouth 3 (three) times daily as needed for muscle spasms.     doxycycline 100 MG tablet Commonly known as: VIBRA-TABS Take 1 tablet (100 mg total) by mouth every 12 (twelve) hours for 14 days. What changed: 14 days   ibuprofen 800 MG tablet Commonly known as: ADVIL Take 1 tablet (800 mg total) by mouth every 6 (six) hours.           Follow-up Information    10/03/2020, MD. Schedule an appointment as soon as possible for a visit in 1 week(s).   Specialty: Obstetrics and Gynecology Contact information: [address] [place] [phone]  Barnett Applebaum, MD

## 2020-10-13 ENCOUNTER — Ambulatory Visit: Payer: Self-pay | Admitting: Obstetrics & Gynecology

## 2021-04-04 ENCOUNTER — Other Ambulatory Visit: Payer: Self-pay

## 2021-04-04 ENCOUNTER — Emergency Department
Admission: EM | Admit: 2021-04-04 | Discharge: 2021-04-04 | Disposition: A | Discharge status: Home / Self Care | Payer: Self-pay | Attending: Emergency Medicine | Admitting: Emergency Medicine

## 2021-04-04 ENCOUNTER — Emergency Department: Payer: Self-pay

## 2021-04-04 DIAGNOSIS — F17210 Nicotine dependence, cigarettes, uncomplicated: Secondary | ICD-10-CM | POA: Insufficient documentation

## 2021-04-04 DIAGNOSIS — M25561 Pain in right knee: Secondary | ICD-10-CM | POA: Insufficient documentation

## 2021-04-04 DIAGNOSIS — G8929 Other chronic pain: Secondary | ICD-10-CM | POA: Insufficient documentation

## 2021-04-04 MED ORDER — NAPROXEN 500 MG PO TABS: 500.0000 mg | ORAL_TABLET | Freq: Two times a day (BID) | ORAL | 0 refills | Status: DC

## 2021-04-04 NOTE — ED Provider Notes (Signed)
[facility] Emergency Department Provider Note   ____________________________________________   Event Date/Time   First MD Initiated Contact with Patient 04/04/21 [phone]     (approximate)  I have reviewed the triage vital signs and the nursing notes.   HISTORY  Chief Complaint Knee Pain    HPI Jillian Gray is a 40 y.o. female presents to the ED with complaint of knee pain.  Patient states that she has been having problems with her knee for many years however for the last several days her knee pain has gotten worse.  Patient reports that when she walks her knee feels unstable.  She has had x-rays in in 2017 and was told that she had osteoarthritis.  She has not seen a primary care or orthopedist for her knee.  She denies any recent injury to her knee.  Currently she is taking BCs without any relief.     Past Medical History:  Diagnosis Date  . Anemia     Patient Active Problem List   Diagnosis Date Noted  . Tubo-ovarian abscess 09/30/2020  . Pelvic pain   . Cyst of left ovary   . PID (acute pelvic inflammatory disease)     Past Surgical History:  Procedure Laterality Date  . CESAREAN SECTION    . LAPAROSCOPIC OVARIAN CYSTECTOMY Left 09/30/2020   Procedure: LAPAROSCOPY; DRAINAGE OF LEFT TUBO-OVARIAN ABCESS; LYSIS OF ADHESIONS;  Surgeon: Nadara Mustard, MD;  Location: [facility] ORS;  Service: Gynecology;  Laterality: Left;    Prior to Admission medications   Medication Sig Start Date End Date Taking? Authorizing Provider  naproxen (NAPROSYN) 500 MG tablet Take 1 tablet (500 mg total) by mouth 2 (two) times daily with a meal. 04/04/21  Yes Bridget Hartshorn L, PA-C    Allergies Chocolate and Penicillins  No family history on file.  Social History Social History   Tobacco Use  . Smoking status: Current Every Day Smoker    Packs/day: 0.50    Types: Cigarettes  . Smokeless tobacco: Never Used  Substance Use Topics  . Alcohol use: No  . Drug  use: Yes    Types: Marijuana    Review of Systems Constitutional: No fever/chills Eyes: No visual changes. Cardiovascular: Denies chest pain. Respiratory: Denies shortness of breath. Gastrointestinal: No abdominal pain.  No nausea, no vomiting Genitourinary: Negative for dysuria. Musculoskeletal: Positive for right knee pain. Skin: Negative for rash. Neurological: Negative for headaches, focal weakness or numbness. ____________________________________________   PHYSICAL EXAM:  VITAL SIGNS: ED Triage Vitals  Enc Vitals Group     BP      Pulse      Resp      Temp      Temp src      SpO2      Weight      Height      Head Circumference      Peak Flow      Pain Score      Pain Loc      Pain Edu?      Excl. in GC?    Constitutional: Alert and oriented. Well appearing and in no acute distress. Eyes: Conjunctivae are normal.  Head: Atraumatic. Neck: No stridor.   Cardiovascular: Normal rate, regular rhythm. Grossly normal heart sounds.  Good peripheral circulation. Respiratory: Normal respiratory effort.  No retractions. Lungs CTAB. Gastrointestinal: Soft and nontender. No distention.  Musculoskeletal: On examination of the right knee there is no gross deformity, no effusion no warmth  or redness.  Patient does have crepitus with range of motion.  Patient is able to bear weight and ambulate without any assistance.  Skin is intact.  No evidence of trauma.  Diffuse tenderness is present but more prominent on the medial aspect. Neurologic:  Normal speech and language. No gross focal neurologic deficits are appreciated. No gait instability. Skin:  Skin is warm, dry and intact. No rash noted. Psychiatric: Mood and affect are normal. Speech and behavior are normal.  ____________________________________________   LABS (all labs ordered are listed, but only abnormal results are displayed)  Labs Reviewed - No data to display  RADIOLOGY I, Tommi Rumps, personally viewed and  evaluated these images (plain radiographs) as part of my medical decision making, as well as reviewing the written report by the radiologist.   Official radiology report(s): DG Knee Complete 4 Views Right  Result Date: 04/04/2021 CLINICAL DATA:  Right leg pain that started 04/02/2021 after stepping off a ladder EXAM: RIGHT KNEE - COMPLETE 4+ VIEW COMPARISON:  02/17/2013 FINDINGS: No evidence of fracture, dislocation, or joint effusion. No evidence of arthropathy or other focal bone abnormality. Soft tissues are unremarkable. IMPRESSION: Negative. Electronically Signed   By: Marnee Spring M.D.   On: 04/04/2021 10:35    ____________________________________________   PROCEDURES  Procedure(s) performed (including Critical Care):  Procedures   ____________________________________________   INITIAL IMPRESSION / ASSESSMENT AND PLAN / ED COURSE  As part of my medical decision making, I reviewed the following data within the electronic MEDICAL RECORD NUMBER Notes from prior ED visits and  Controlled Substance Database  40 year old female presents to the ED with complaint of right knee pain that has been going on for several years.  She states that in the last 2 days she has had more trouble with it and has been taking BC powders which contain aspirin.  Has been told in the past and also seen in the ED in 2017 that she has osteoarthritis.  Patient has not seen an orthopedist for her knee.  Today's exam is benign with exception to some crepitus and tenderness on the medial aspect.  Patient was placed in a knee immobilizer.  X-rays were discussed.  She is to discontinue taking BC powders and begin taking naproxen 500 mg twice daily with food.  She is to call Dr. Samuel Germany office and make an appointment for her chronic pain.  ____________________________________________   FINAL CLINICAL IMPRESSION(S) / ED DIAGNOSES  Final diagnoses:  Chronic pain of right knee     ED Discharge Orders          Ordered    naproxen (NAPROSYN) 500 MG tablet  2 times daily with meals        04/04/21 1116          *Please note:  Jillian Gray was evaluated in Emergency Department on 04/04/2021 for the symptoms described in the history of present illness. She was evaluated in the context of the global COVID-19 pandemic, which necessitated consideration that the patient might be at risk for infection with the SARS-CoV-2 virus that causes COVID-19. Institutional protocols and algorithms that pertain to the evaluation of patients at risk for COVID-19 are in a state of rapid change based on information released by regulatory bodies including the CDC and federal and state organizations. These policies and algorithms were followed during the patient's care in the ED.  Some ED evaluations and interventions may be delayed as a result of limited staffing during and the pandemic.*  Note:  This document was prepared using Dragon voice recognition software and may include unintentional dictation errors.    Tommi Rumps, PA-C 04/04/21 1124    Phineas Semen, MD 04/04/21 [phone]

## 2021-04-04 NOTE — ED Triage Notes (Signed)
Pt via POV with C/O R knee and lower leg pain that started 4/18 when she began walking after coming down off of a ladder. Slight swelling noted to anterior inner aspect of R knee.

## 2021-04-04 NOTE — ED Notes (Addendum)
All documentation completed by Dorian James SRN reviewed and approved by this RN 

## 2021-04-04 NOTE — Discharge Instructions (Addendum)
Discontinue taking the BC powders and begin taking naproxen 500 mg twice daily with food.  Wear the knee immobilizer for support and protection.  You not have to wear this at night when sleeping.  You will need to call Dr. Samuel Germany office and make an appointment for your continued knee pain.

## 2022-04-10 ENCOUNTER — Emergency Department
Admission: EM | Admit: 2022-04-10 | Discharge: 2022-04-10 | Disposition: A | Discharge status: Home / Self Care | Payer: Self-pay | Attending: Emergency Medicine | Admitting: Emergency Medicine

## 2022-04-10 ENCOUNTER — Emergency Department: Payer: Self-pay

## 2022-04-10 ENCOUNTER — Other Ambulatory Visit: Payer: Self-pay

## 2022-04-10 DIAGNOSIS — R209 Unspecified disturbances of skin sensation: Secondary | ICD-10-CM | POA: Insufficient documentation

## 2022-04-10 DIAGNOSIS — M79604 Pain in right leg: Secondary | ICD-10-CM | POA: Insufficient documentation

## 2022-04-10 DIAGNOSIS — M25571 Pain in right ankle and joints of right foot: Secondary | ICD-10-CM | POA: Insufficient documentation

## 2022-04-10 DIAGNOSIS — M25561 Pain in right knee: Secondary | ICD-10-CM | POA: Insufficient documentation

## 2022-04-10 MED ORDER — METHOCARBAMOL 500 MG PO TABS
500.0000 mg | ORAL_TABLET | Freq: Three times a day (TID) | ORAL | 0 refills | Status: AC | PRN
Start: 2022-04-10 — End: 2022-04-15

## 2022-04-10 MED ORDER — MELOXICAM 15 MG PO TABS: 15.0000 mg | ORAL_TABLET | Freq: Every day | ORAL | 0 refills | Status: AC

## 2022-04-10 NOTE — ED Provider Notes (Signed)
? ?[facility] ?Provider Note ? ?Patient Contact: 4:19 PM (approximate) ? ? ?History  ? ?Leg Pain ? ? ?HPI ? ?Jillian Gray is a 41 y.o. female presents to the emergency department with acute right knee pain after stepping off a pallet.  Patient has intermittent tingling of the right leg but denies pain in her back.  No bowel or bladder incontinence or saddle anesthesia.  She has been able to ambulate. ? ?  ? ? ?Physical Exam  ? ?Triage Vital Signs: ?ED Triage Vitals  ?Enc Vitals Group  ?   BP 04/10/22 1450 (!) 141/92  ?   Pulse Rate 04/10/22 1450 81  ?   Resp 04/10/22 1450 17  ?   Temp 04/10/22 1450 97.9 ?F (36.6 ?C)  ?   Temp src --   ?   SpO2 04/10/22 1450 100 %  ?   Weight --   ?   Height --   ?   Head Circumference --   ?   Peak Flow --   ?   Pain Score 04/10/22 1449 8  ?   Pain Loc --   ?   Pain Edu? --   ?   Excl. in GC? --   ? ? ?Most recent vital signs: ?Vitals:  ? 04/10/22 1450  ?BP: (!) 141/92  ?Pulse: 81  ?Resp: 17  ?Temp: 97.9 ?F (36.6 ?C)  ?SpO2: 100%  ? ? ? ?General: Alert and in no acute distress. ?Eyes:  PERRL. EOMI. ?Head: No acute traumatic findings ?ENT: ?     Ears:  ?     Nose: No congestion/rhinnorhea. ?     Mouth/Throat: Mucous membranes are moist.  ?Neck: No stridor. No cervical spine tenderness to palpation. ?Cardiovascular:  Good peripheral perfusion ?Respiratory: Normal respiratory effort without tachypnea or retractions. Lungs CTAB. Good air entry to the bases with no decreased or absent breath sounds. ?Gastrointestinal: Bowel sounds ?4 quadrants. Soft and nontender to palpation. No guarding or rigidity. No palpable masses. No distention. No CVA tenderness. ?Musculoskeletal: Full range of motion to all extremities.  Palpable dorsalis pedis pulse bilaterally and symmetrically. ?Neurologic:  No gross focal neurologic deficits are appreciated.  ?Skin:   No rash noted ?Other: ? ? ?ED Results / Procedures / Treatments  ? ?Labs ?(all labs ordered are listed, but only  abnormal results are displayed) ?Labs Reviewed - No data to display ? ? ? ? ? ?RADIOLOGY ? ?I personally viewed and evaluated these images as part of my medical decision making, as well as reviewing the written report by the radiologist. ? ?ED Provider Interpretation: I personally reviewed x-rays of the right knee and right ankle and no acute bony abnormality was visualized. ? ? ?PROCEDURES: ? ?Critical Care performed: No ? ?Procedures ? ? ?MEDICATIONS ORDERED IN ED: ?Medications - No data to display ? ? ?IMPRESSION / MDM / ASSESSMENT AND PLAN / ED COURSE  ?I reviewed the triage vital signs and the nursing notes. ?             ?               ? ?Differential diagnosis includes, but is not limited to, fracture versus sprain ? ?Leg pain ?41 year old female presents to the emergency department with acute right knee pain and ankle pain after stepping down awkwardly from a pallet at work. ? ?Vital signs are reassuring at triage.  On physical exam, patient was alert, active and nontoxic-appearing. ? ?Patient was  discharged with meloxicam and Robaxin and return precautions were given to return with new or worsening symptoms. ? ?FINAL CLINICAL IMPRESSION(S) / ED DIAGNOSES  ? ?Final diagnoses:  ?None  ? ? ? ?Rx / DC Orders  ? ?ED Discharge Orders   ? ? None  ? ?  ? ? ? ?Note:  This document was prepared using Dragon voice recognition software and may include unintentional dictation errors. ?  ?Orvil Feil, PA-C ?04/10/22 1700 ? ?  ?Minna Antis, MD ?04/10/22 2124 ? ?

## 2022-04-10 NOTE — Discharge Instructions (Signed)
You can take meloxicam once a day. ?You can take Robaxin up to 3 times daily. ?

## 2022-04-10 NOTE — ED Triage Notes (Signed)
Pt comes with c/o right leg and knee pain for few days. Pt state she was at work and stepped off wrong and injured it. Pt denies wanting to file WC. Pt states pain that runs down leg.  ?

## 2022-04-15 IMAGING — US US PELVIS COMPLETE TRANSABD/TRANSVAG W DUPLEX
1 series · 13 of 25 positions shown · non-contrast
Comparison: None.

CLINICAL DATA: Left lower quadrant pain. Vaginal bleeding. History
of ruptured ovarian cyst.

EXAM:
TRANSABDOMINAL AND TRANSVAGINAL ULTRASOUND OF PELVIS
DOPPLER ULTRASOUND OF OVARIES
TECHNIQUE: Both transabdominal and transvaginal ultrasound examinations of the
pelvis were performed. Transabdominal technique was performed for
global imaging of the pelvis including uterus, ovaries, adnexal
regions, and pelvic cul-de-sac.
It was necessary to proceed with endovaginal exam following the
transabdominal exam to visualize the endometrium and ovaries. Color
and duplex Doppler ultrasound was utilized to evaluate blood flow to
the ovaries.

[Series 1: us pelvic complete w transvaginal and torsion righ · 13 of 114 slices shown]
[im 1/114]
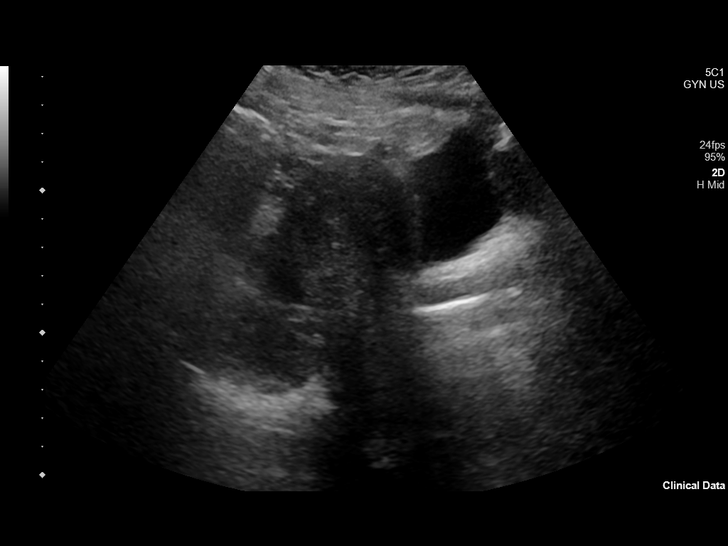
[im 10/114]
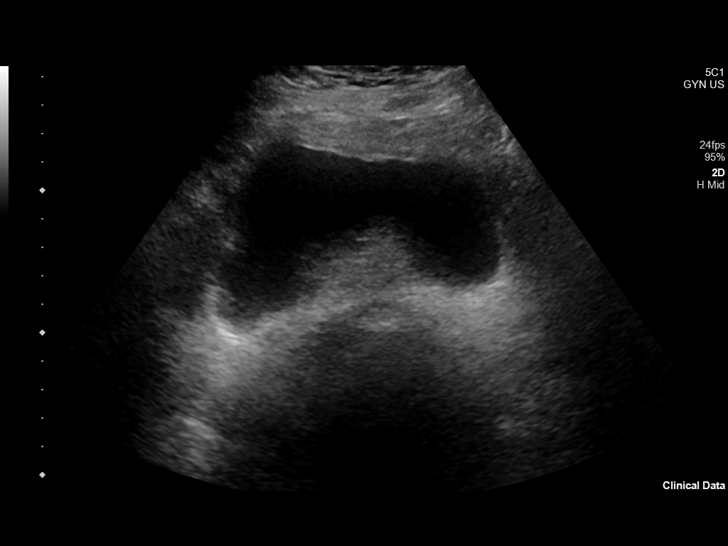
[im 19/114]
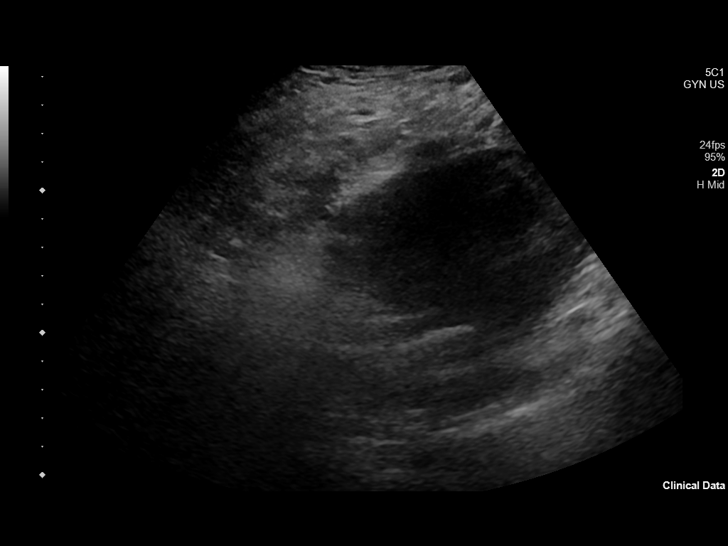
[im 29/114]
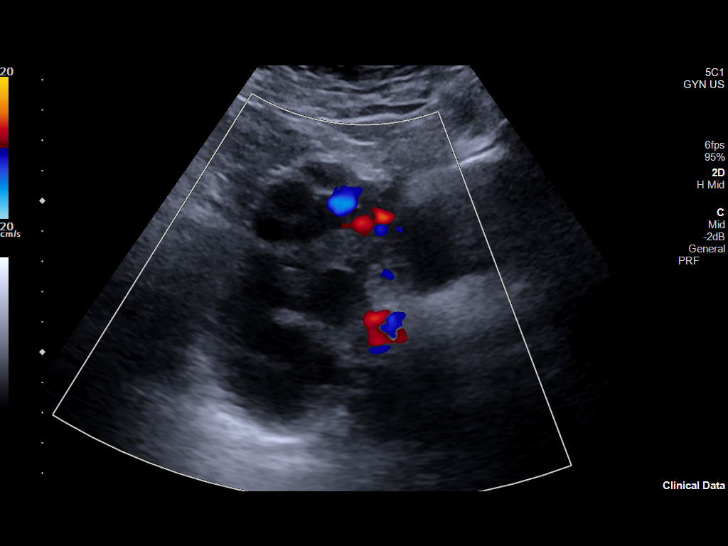
[im 38/114]
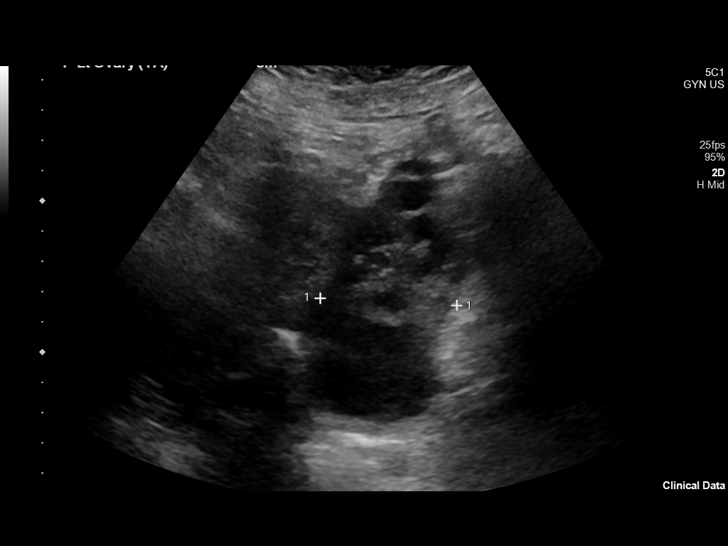
[im 48/114]
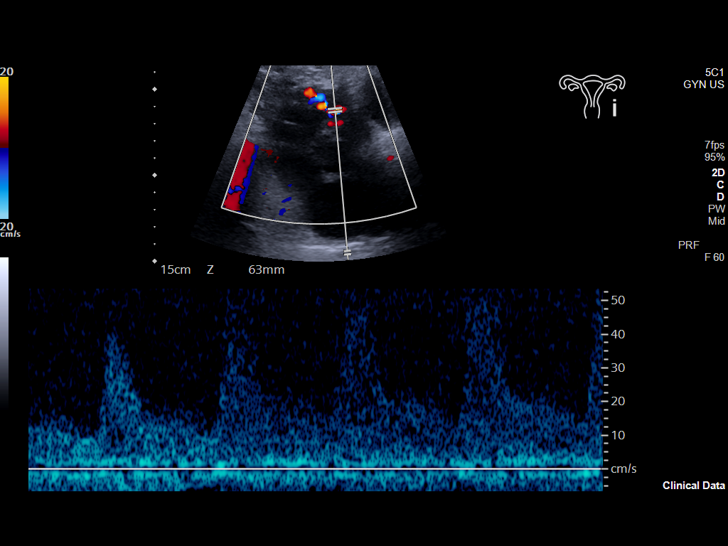
[im 57/114]
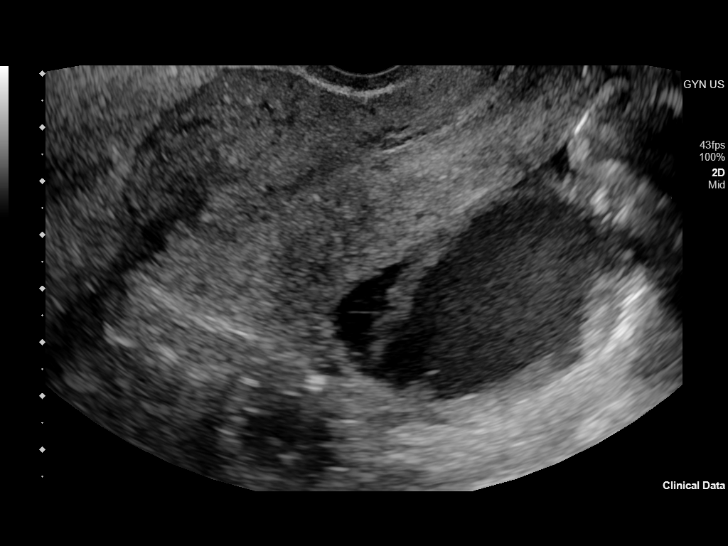
[im 66/114]
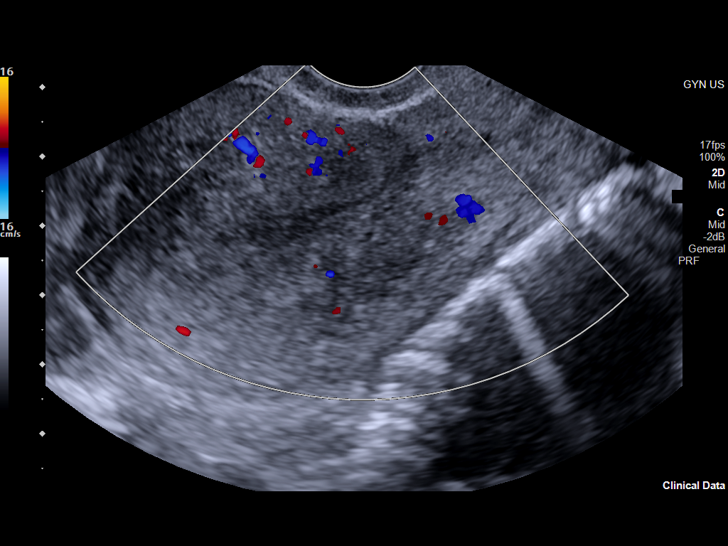
[im 76/114]
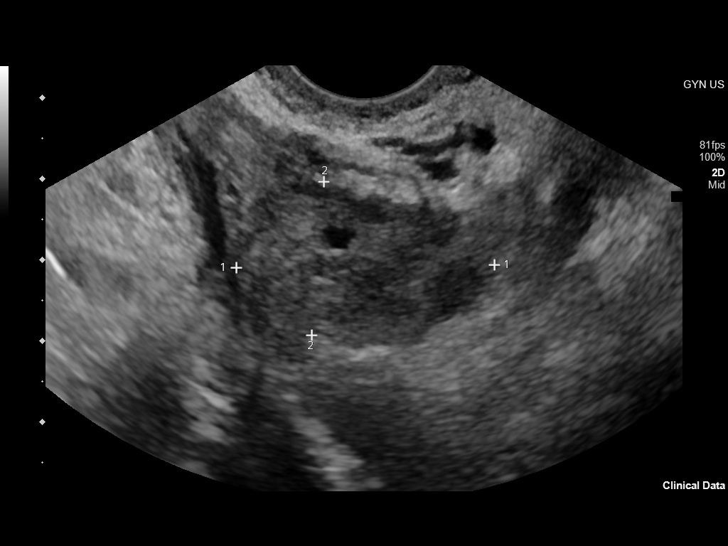
[im 85/114]
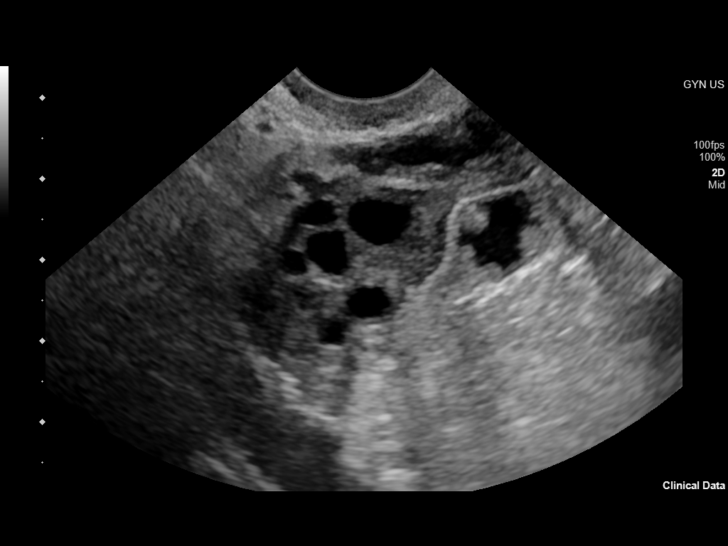
[im 95/114]
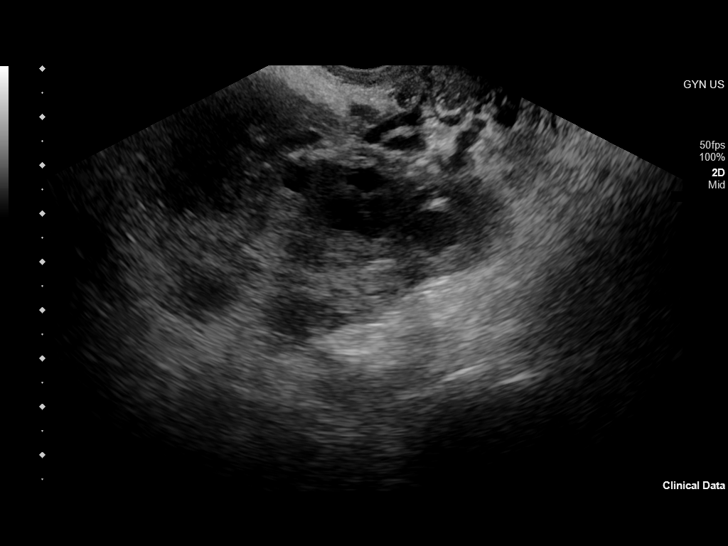
[im 104/114]
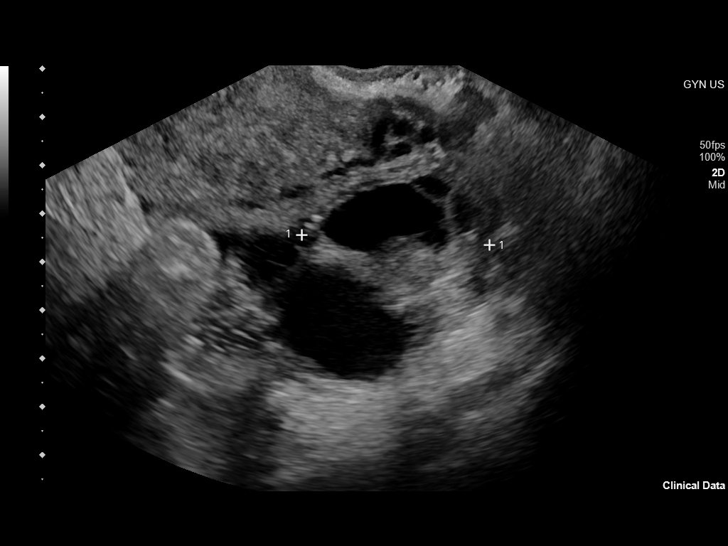
[im 114/114]
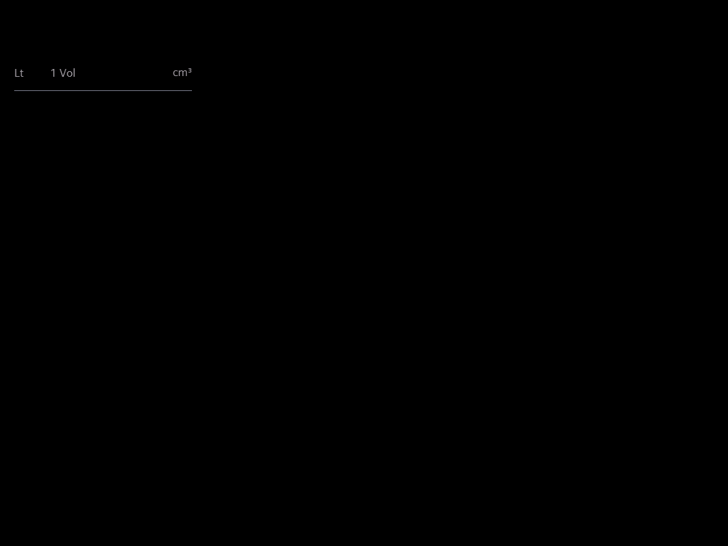

[13 of 25 positions shown; findings below may reference images not displayed]

FINDINGS: Uterus

Measurements: 9.6 x 4.8 x 5.6 cm = volume: 136.65 mL. No fibroids or
other mass visualized.

Endometrium

Thickness: 9.2 mm.  No focal abnormality visualized.

Right ovary

Measurements: 3.2 x 1.9 x 2.3 cm = volume: 7.24 mL. Normal
appearance/no adnexal mass.

Left ovary

Measurements: 8.5 x 4.3 x 3.9 cm = volume: 74.71 mL. The left ovary
is enlarged and contains multiple follicles. There is a hypoechoic
mass in the periphery of the left ovary measuring 6 x 3.4 x 4.8 cm,
likely a complicated follicle or cyst. This appears to be within
rather than adjacent to the left ovary.

Pulsed Doppler evaluation of both ovaries demonstrates normal
low-resistance arterial and venous waveforms.

Other findings

Trace fluid in the pelvis, likely physiologic.
IMPRESSION: 1. The left ovary is enlarged measuring 8.5 x 4.3 x 3.9 cm. It
contains multiple simple appearing follicles and cysts. It also
contains a hypoechoic mass measuring 6 x 3.4 x 4.8 cm favored to
represent a hemorrhagic or complex follicle/cyst. There is blood
flow within the left ovary with no evidence of torsion at the time
of this study. Recommend a follow-up ultrasound in 6-12 weeks to
ensure resolution of the suspected complicated cyst/follicle.
2. The endometrium is within normal limits measuring 9.2 mm. If
bleeding remains unresponsive to hormonal or medical therapy,
sonohysterogram should be considered for focal lesion work-up. (Ref:
Radiological Reasoning: Algorithmic Workup of Abnormal Vaginal
Bleeding with Endovaginal Sonography and Sonohysterography. AJR
[id number]; 191:S68-73)

## 2023-10-24 IMAGING — DX DG ANKLE COMPLETE 3+V*R*
3 series · 3 of 3 positions shown · non-contrast
Comparison: None.

CLINICAL DATA: Pain after stepping wrong 2 days ago.

EXAM:
RIGHT ANKLE - COMPLETE 3+ VIEW

[ankle ap]
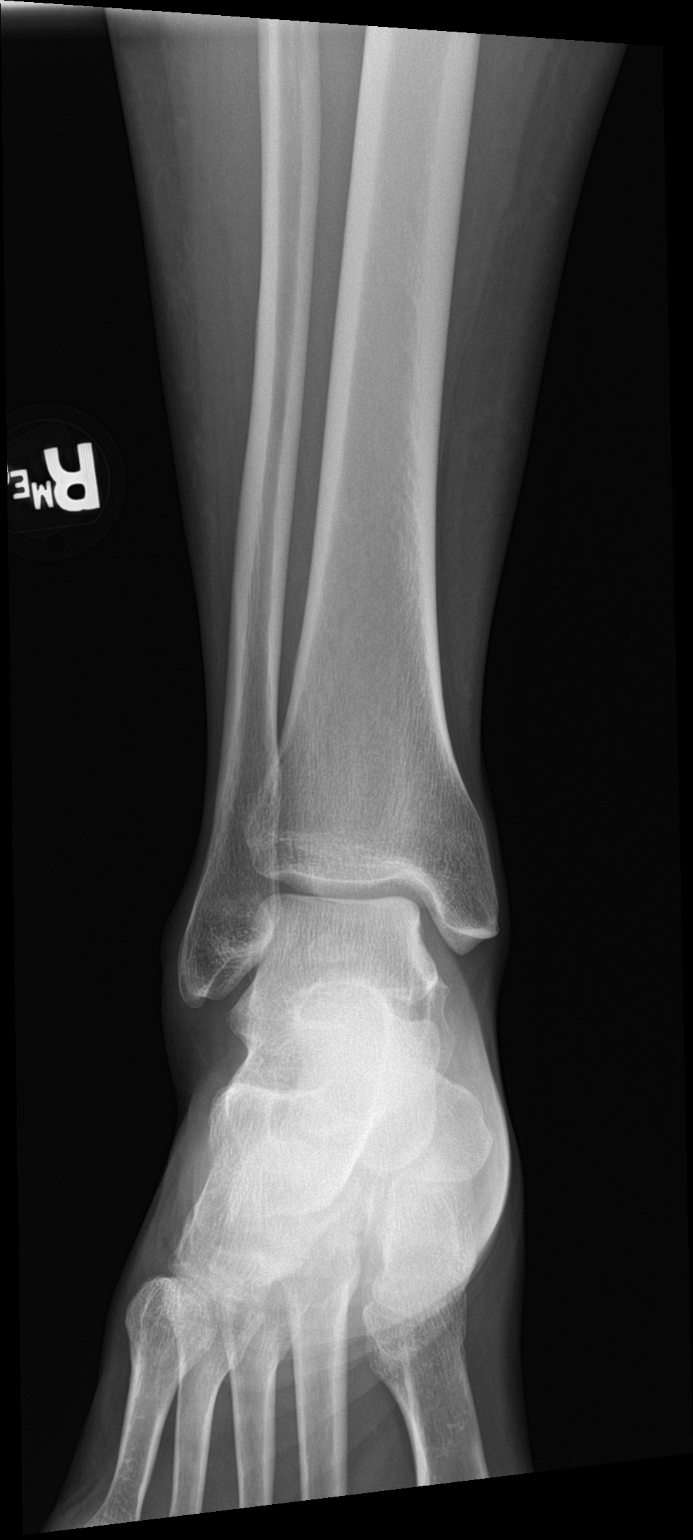

[ankle obl]
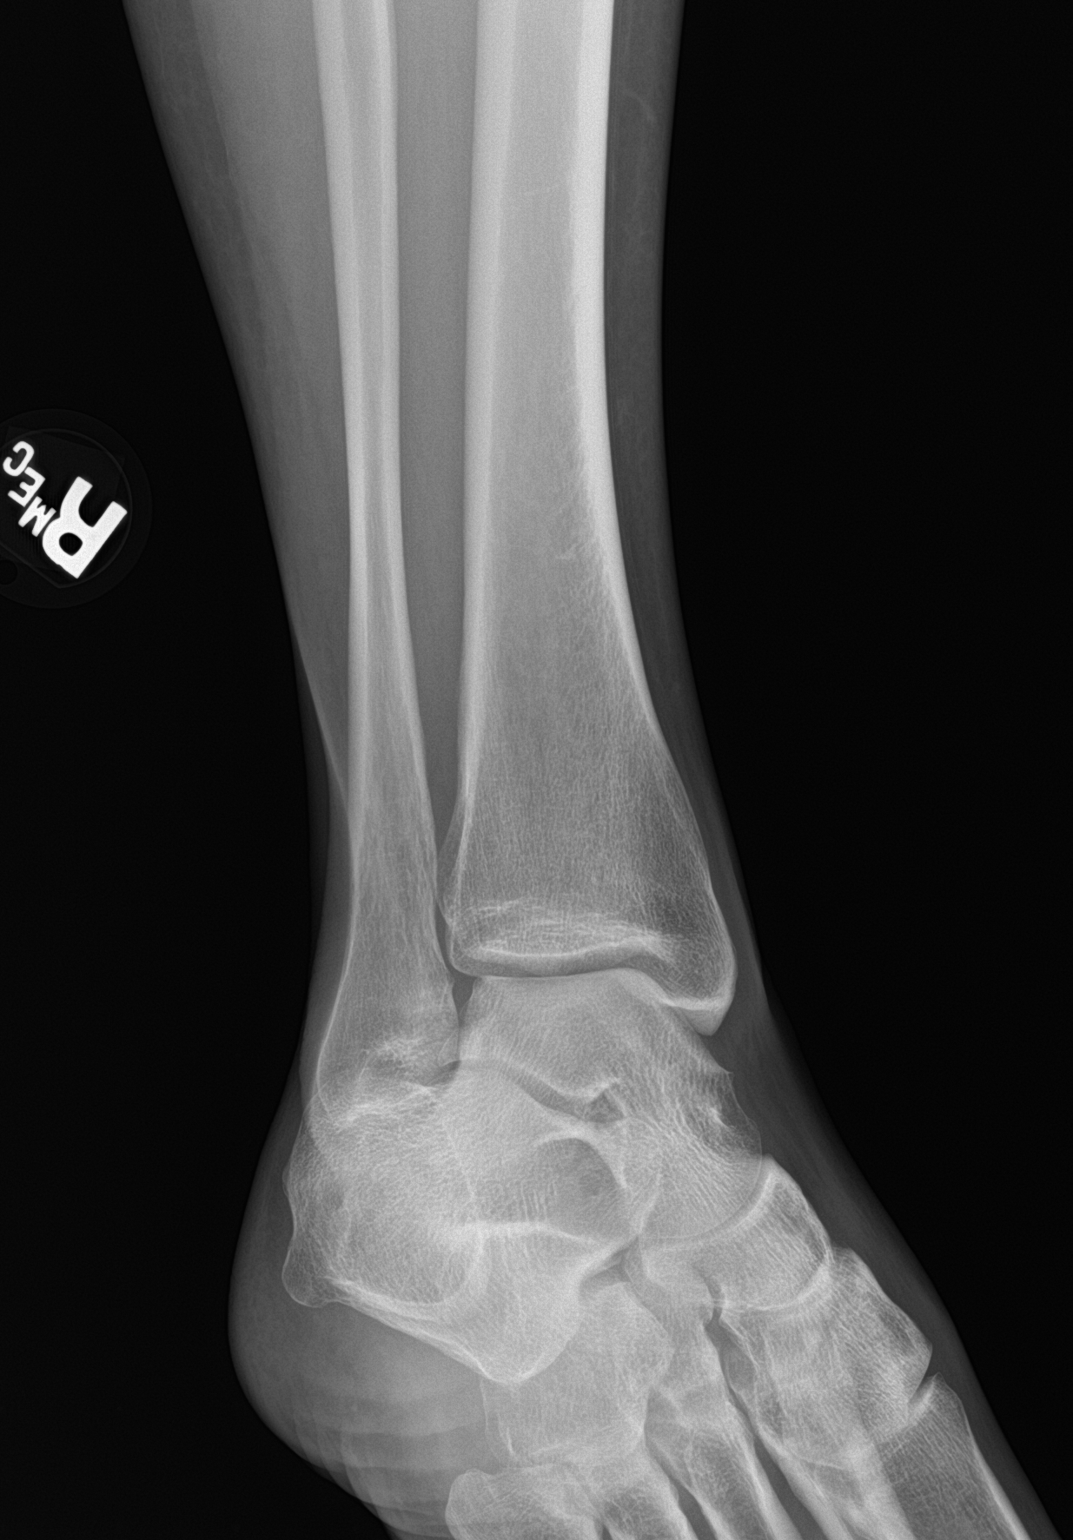

[ankle lat]
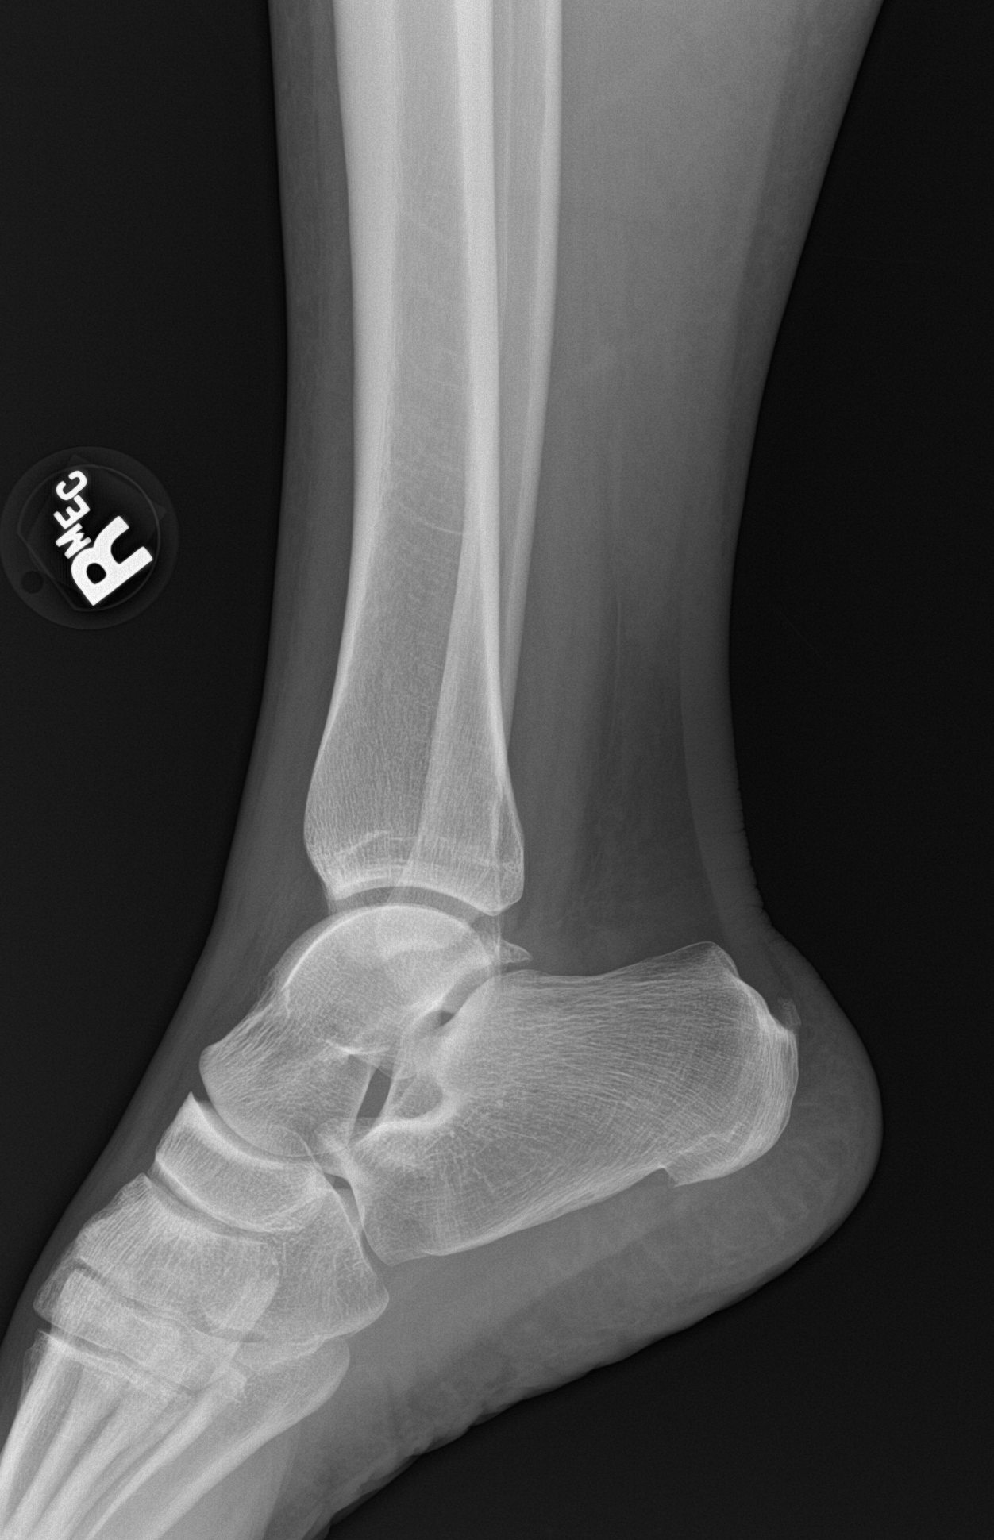

[3 of 3 positions shown; findings below may reference images not displayed]

FINDINGS: There is no evidence of fracture, dislocation, or joint effusion.
There is no evidence of arthropathy. Superior calcaneal
enthesophyte. Soft tissues are unremarkable.
IMPRESSION: No acute osseous abnormality.

## 2024-03-16 ENCOUNTER — Emergency Department
Admission: EM | Admit: 2024-03-16 | Discharge: 2024-03-16 | Disposition: A | Discharge status: Home / Self Care | Attending: Emergency Medicine | Admitting: Emergency Medicine

## 2024-03-16 ENCOUNTER — Encounter: Payer: Self-pay | Admitting: Emergency Medicine

## 2024-03-16 ENCOUNTER — Emergency Department

## 2024-03-16 ENCOUNTER — Other Ambulatory Visit: Payer: Self-pay

## 2024-03-16 DIAGNOSIS — M79644 Pain in right finger(s): Secondary | ICD-10-CM | POA: Insufficient documentation

## 2024-03-16 MED ORDER — MELOXICAM 15 MG PO TABS: 15.0000 mg | ORAL_TABLET | Freq: Every day | ORAL | 0 refills | Status: AC

## 2024-03-16 NOTE — ED Notes (Signed)
 See triage note  Presents with right hand pain States pain is mainly to thumb area   felt a pop   having increased pain today

## 2024-03-16 NOTE — Discharge Instructions (Addendum)
 You were evaluated in the ED for right thumb pain.  Your x-rays are normal.  We have placed you in a splint to wear for comfort.  If symptoms do not improve with medication please follow-up with orthopedics for further evaluation.   Get plenty of rest.  Elevate your thumb above heart level and apply ice to the affected.

## 2024-03-16 NOTE — ED Triage Notes (Signed)
 Pt here with a right hand injury. Pt states she was doing something and felt her thumb pop on Sat. Pt states she was trying to work through the pain but it is now unbearable. Pt hand is also swollen.

## 2024-03-16 NOTE — ED Provider Notes (Signed)
 [facility] Emergency Department Provider Note     Event Date/Time   First MD Initiated Contact with Patient 03/16/24 1026     (approximate)   History   Hand Injury   HPI  Jillian Gray is a 43 y.o. female with a past medical history of anemia presents to the ED for evaluation of right thumb pain x 3 days.  Patient reports on Saturday she was doing yard work when she felt like her thumb popped out of place.  Patient reports she returned to work the next day having to [employer] and reports the pain worsened in severity with gripping and lifting.  No known injury or trauma.  Patient reports history of arthritis x 2 years. Denies numbness, fever, recent illness.      Physical Exam   Triage Vital Signs: ED Triage Vitals  Encounter Vitals Group     BP 03/16/24 1013 (!) 153/107     Systolic BP Percentile --      Diastolic BP Percentile --      Pulse Rate 03/16/24 1012 92     Resp 03/16/24 1012 16     Temp 03/16/24 1012 98.4 F (36.9 C)     Temp Source 03/16/24 1012 Oral     SpO2 03/16/24 1012 97 %     Weight 03/16/24 1013 175 lb 0.7 oz (79.4 kg)     Height 03/16/24 1013 5\' 7"  (1.702 m)     Head Circumference --      Peak Flow --      Pain Score 03/16/24 1013 9     Pain Loc --      Pain Education --      Exclude from Growth Chart --     Most recent vital signs: Vitals:   03/16/24 1012 03/16/24 1013  BP:  (!) 153/107  Pulse: 92   Resp: 16   Temp: 98.4 F (36.9 C)   SpO2: 97%     General Awake, no distress.  HEENT NCAT. PERRL. EOMI.  CV:  Good peripheral perfusion.  RESP:  Normal effort.  ABD:  No distention.  Other:  Right thumb reveals no visible deformity.  Tenderness over mid proximal phalanx and thenar eminence.  Neurovascular status intact all throughout. PROM>AROM limited due to pain.  No crepitus, redness or warmth. Negative finkelstein test.    ED Results / Procedures / Treatments   Labs (all labs ordered are  listed, but only abnormal results are displayed) Labs Reviewed - No data to display  RADIOLOGY  I personally viewed and evaluated these images as part of my medical decision making, as well as reviewing the written report by the radiologist.  ED Provider Interpretation: Right thumb x-ray appears normal.  DG Finger Thumb Right Result Date: 03/16/2024 CLINICAL DATA:  finger injury.  Pain. EXAM: RIGHT THUMB 2+V COMPARISON:  None Available. FINDINGS: There is a 2 x 2 mm ossific density along the base of the proximal phalanx of thumb, favored to represent accessory ossicle. However correlate clinically for point tenderness to exclude an avulsion fracture, which is favored less likely. No acute fracture or dislocation. No aggressive osseous lesion. No significant arthritis of imaged joints. No radiopaque foreign bodies. Soft tissues are within normal limits. IMPRESSION: *There is a 2 x 2 mm ossific density along the base of the proximal phalanx of thumb, favored to represent accessory ossicle. However correlate clinically for point tenderness to exclude an avulsion fracture, which is favored  less likely. Electronically Signed   By: Jules Schick M.D.   On: 03/16/2024 12:00   PROCEDURES:  Critical Care performed: No  Procedures  MEDICATIONS ORDERED IN ED: Medications - No data to display   IMPRESSION / MDM / ASSESSMENT AND PLAN / ED COURSE  I reviewed the triage vital signs and the nursing notes.                               43 y.o. female presents to the emergency department for evaluation and treatment of right thumb pain without injury x 4 days. See HPI for further details.   Differential diagnosis includes, but is not limited to septic joint, arthritis, dislocation, nerve impingement  Patient's presentation is most consistent with acute complicated illness / injury requiring diagnostic workup.  Patient is well-appearing.  Her physical exam findings are as stated above.  Low suspicion  for septic joint given absent physical exam findings.  X-rays are reassuring.  Low suspicion for an avulsion fracture given history and no MOI.  Patient will be placed in thumb spica splint.  RICE therapy education provided.  Encouraged patient to follow-up with orthopedic if symptoms do not improve in 1 week.  Patient is in stable condition for discharge home.  ED return precautions discussed.    FINAL CLINICAL IMPRESSION(S) / ED DIAGNOSES   Final diagnoses:  Finger pain, right     Rx / DC Orders   ED Discharge Orders          Ordered    meloxicam (MOBIC) 15 MG tablet  Daily        03/16/24 1216             Note:  This document was prepared using Dragon voice recognition software and may include unintentional dictation errors.    Romeo Apple, Nesha Counihan A, PA-C 03/16/24 1232    Delton [place], MD 03/16/24 1540
